# Patient Record
Sex: Female | Born: 1975 | Race: White | Hispanic: No | Marital: Married | Smoking: Former smoker
Health system: Southern US, Community
[De-identification: ages and names within clinical notes are randomized; demographics above are authoritative.]

## PROBLEM LIST (undated history)

## (undated) DIAGNOSIS — F32A Depression, unspecified: Secondary | ICD-10-CM

## (undated) DIAGNOSIS — K219 Gastro-esophageal reflux disease without esophagitis: Secondary | ICD-10-CM

## (undated) DIAGNOSIS — F329 Major depressive disorder, single episode, unspecified: Secondary | ICD-10-CM

## (undated) HISTORY — PX: WRIST FRACTURE SURGERY: SHX121

---

## 2005-02-21 ENCOUNTER — Encounter: Admission: RE | Admit: 2005-02-21 | Discharge: 2005-02-21 | Payer: Self-pay | Admitting: Family Medicine

## 2005-05-09 HISTORY — PX: OTHER SURGICAL HISTORY: SHX169

## 2005-09-14 ENCOUNTER — Other Ambulatory Visit: Admission: RE | Admit: 2005-09-14 | Discharge: 2005-09-14 | Payer: Self-pay | Admitting: Gynecology

## 2006-01-24 ENCOUNTER — Encounter (INDEPENDENT_AMBULATORY_CARE_PROVIDER_SITE_OTHER): Payer: Self-pay | Admitting: *Deleted

## 2006-01-24 ENCOUNTER — Ambulatory Visit (HOSPITAL_COMMUNITY): Admission: RE | Admit: 2006-01-24 | Discharge: 2006-01-24 | Payer: Self-pay | Admitting: Obstetrics and Gynecology

## 2007-01-01 ENCOUNTER — Other Ambulatory Visit: Admission: RE | Admit: 2007-01-01 | Discharge: 2007-01-01 | Payer: Self-pay | Admitting: Gynecology

## 2007-01-01 ENCOUNTER — Ambulatory Visit: Payer: Self-pay | Admitting: Oncology

## 2007-01-10 LAB — CBC WITH DIFFERENTIAL (CANCER CENTER ONLY)
BASO#: 0.1 10*3/uL (ref 0.0–0.2)
BASO%: 0.7 % (ref 0.0–2.0)
EOS%: 1.9 % (ref 0.0–7.0)
Eosinophils Absolute: 0.1 10*3/uL (ref 0.0–0.5)
HCT: 40.4 % (ref 34.8–46.6)
HGB: 14.1 g/dL (ref 11.6–15.9)
LYMPH#: 2.9 10*3/uL (ref 0.9–3.3)
LYMPH%: 41.4 % (ref 14.0–48.0)
MCH: 30.9 pg (ref 26.0–34.0)
MCHC: 34.9 g/dL (ref 32.0–36.0)
MCV: 89 fL (ref 81–101)
MONO#: 0.5 10*3/uL (ref 0.1–0.9)
MONO%: 7.4 % (ref 0.0–13.0)
NEUT#: 3.4 10*3/uL (ref 1.5–6.5)
NEUT%: 48.6 % (ref 39.6–80.0)
Platelets: 275 10*3/uL (ref 145–400)
RBC: 4.56 10*6/uL (ref 3.70–5.32)
RDW: 11.4 % (ref 10.5–14.6)
WBC: 7 10*3/uL (ref 3.9–10.0)

## 2007-01-13 LAB — COMPREHENSIVE METABOLIC PANEL
ALT: 18 U/L (ref 0–35)
AST: 21 U/L (ref 0–37)
Albumin: 3.5 g/dL (ref 3.5–5.2)
Alkaline Phosphatase: 53 U/L (ref 39–117)
BUN: 13 mg/dL (ref 6–23)
CO2: 25 mEq/L (ref 19–32)
Calcium: 8.8 mg/dL (ref 8.4–10.5)
Chloride: 102 mEq/L (ref 96–112)
Creatinine, Ser: 0.78 mg/dL (ref 0.40–1.20)
Glucose, Bld: 85 mg/dL (ref 70–99)
Potassium: 4.5 mEq/L (ref 3.5–5.3)
Sodium: 133 mEq/L — ABNORMAL LOW (ref 135–145)
Total Bilirubin: 0.5 mg/dL (ref 0.3–1.2)
Total Protein: 6.3 g/dL (ref 6.0–8.3)

## 2007-01-13 LAB — HYPERCOAGULABLE PANEL, COMPREHENSIVE
AntiThromb III Func: 97 % (ref 76–126)
Anticardiolipin IgA: <7 [APL'U] (ref <13)
Anticardiolipin IgG: 8 [GPL'U] (ref <11)
Anticardiolipin IgM: <7 [MPL'U] (ref <10)
Beta-2 Glyco I IgG: <4 U/mL (ref <20)
Beta-2-Glycoprotein I IgA: 4 U/mL (ref <10)
Beta-2-Glycoprotein I IgM: <4 U/mL (ref <10)
DRVVT: 42.8 secs (ref 36.1–47.0)
Homocysteine: 7.1 umol/L (ref 4.0–15.4)
PTT Lupus Anticoagulant: 58.7 secs — ABNORMAL HIGH (ref 36.3–48.8)
PTTLA 4:1 Mix: 49.5 secs — ABNORMAL HIGH (ref 36.3–48.8)
PTTLA Confirmation: 0 secs (ref <8.0)
Protein C Activity: 100 % (ref 75–133)
Protein C, Total: 72 % (ref 70–140)
Protein S Activity: 112 % (ref 69–129)
Protein S Ag, Total: 110 % (ref 70–140)

## 2007-02-14 ENCOUNTER — Ambulatory Visit (HOSPITAL_COMMUNITY): Admission: RE | Admit: 2007-02-14 | Discharge: 2007-02-14 | Payer: Self-pay | Admitting: Gynecology

## 2008-01-17 ENCOUNTER — Other Ambulatory Visit: Admission: RE | Admit: 2008-01-17 | Discharge: 2008-01-17 | Payer: Self-pay | Admitting: Gynecology

## 2008-06-01 ENCOUNTER — Inpatient Hospital Stay (HOSPITAL_COMMUNITY): Admission: AD | Admit: 2008-06-01 | Discharge: 2008-06-01 | Payer: Self-pay | Admitting: Obstetrics and Gynecology

## 2008-07-08 ENCOUNTER — Ambulatory Visit (HOSPITAL_COMMUNITY): Admission: RE | Admit: 2008-07-08 | Discharge: 2008-07-08 | Payer: Self-pay | Admitting: Obstetrics and Gynecology

## 2008-08-05 ENCOUNTER — Ambulatory Visit (HOSPITAL_COMMUNITY): Admission: RE | Admit: 2008-08-05 | Discharge: 2008-08-05 | Payer: Self-pay | Admitting: Occupational Therapy

## 2008-09-30 ENCOUNTER — Inpatient Hospital Stay (HOSPITAL_COMMUNITY): Admission: AD | Admit: 2008-09-30 | Discharge: 2008-10-12 | Payer: Self-pay | Admitting: Obstetrics & Gynecology

## 2008-10-14 ENCOUNTER — Inpatient Hospital Stay (HOSPITAL_COMMUNITY): Admission: AD | Admit: 2008-10-14 | Discharge: 2008-10-22 | Payer: Self-pay | Admitting: Obstetrics and Gynecology

## 2008-10-18 ENCOUNTER — Encounter (INDEPENDENT_AMBULATORY_CARE_PROVIDER_SITE_OTHER): Payer: Self-pay | Admitting: Obstetrics and Gynecology

## 2008-10-23 ENCOUNTER — Encounter: Admission: RE | Admit: 2008-10-23 | Discharge: 2008-11-21 | Payer: Self-pay | Admitting: Obstetrics and Gynecology

## 2008-11-22 ENCOUNTER — Encounter: Admission: RE | Admit: 2008-11-22 | Discharge: 2008-12-22 | Payer: Self-pay | Admitting: Obstetrics and Gynecology

## 2008-12-23 ENCOUNTER — Encounter: Admission: RE | Admit: 2008-12-23 | Discharge: 2009-01-22 | Payer: Self-pay | Admitting: Obstetrics and Gynecology

## 2009-01-23 ENCOUNTER — Encounter: Admission: RE | Admit: 2009-01-23 | Discharge: 2009-02-21 | Payer: Self-pay | Admitting: Obstetrics and Gynecology

## 2009-02-22 ENCOUNTER — Encounter: Admission: RE | Admit: 2009-02-22 | Discharge: 2009-03-24 | Payer: Self-pay | Admitting: Obstetrics and Gynecology

## 2009-03-25 ENCOUNTER — Encounter: Admission: RE | Admit: 2009-03-25 | Discharge: 2009-04-23 | Payer: Self-pay | Admitting: Obstetrics and Gynecology

## 2009-04-24 ENCOUNTER — Encounter: Admission: RE | Admit: 2009-04-24 | Discharge: 2009-05-07 | Payer: Self-pay | Admitting: Obstetrics and Gynecology

## 2009-05-25 ENCOUNTER — Encounter: Admission: RE | Admit: 2009-05-25 | Discharge: 2009-06-22 | Payer: Self-pay | Admitting: Obstetrics and Gynecology

## 2010-08-16 LAB — CBC
HCT: 27.1 % — ABNORMAL LOW (ref 36.0–46.0)
HCT: 27.3 % — ABNORMAL LOW (ref 36.0–46.0)
HCT: 31.3 % — ABNORMAL LOW (ref 36.0–46.0)
HCT: 36.8 % (ref 36.0–46.0)
Hemoglobin: 11.2 g/dL — ABNORMAL LOW (ref 12.0–15.0)
Hemoglobin: 13.1 g/dL (ref 12.0–15.0)
Hemoglobin: 9.7 g/dL — ABNORMAL LOW (ref 12.0–15.0)
Hemoglobin: 9.8 g/dL — ABNORMAL LOW (ref 12.0–15.0)
MCHC: 35.6 g/dL (ref 30.0–36.0)
MCHC: 35.7 g/dL (ref 30.0–36.0)
MCHC: 35.7 g/dL (ref 30.0–36.0)
MCHC: 35.8 g/dL (ref 30.0–36.0)
MCV: 92.4 fL (ref 78.0–100.0)
MCV: 92.5 fL (ref 78.0–100.0)
MCV: 92.5 fL (ref 78.0–100.0)
MCV: 92.6 fL (ref 78.0–100.0)
Platelets: 246 10*3/uL (ref 150–400)
Platelets: 258 10*3/uL (ref 150–400)
Platelets: 293 10*3/uL (ref 150–400)
Platelets: 335 10*3/uL (ref 150–400)
RBC: 2.93 MIL/uL — ABNORMAL LOW (ref 3.87–5.11)
RBC: 2.95 MIL/uL — ABNORMAL LOW (ref 3.87–5.11)
RBC: 3.39 MIL/uL — ABNORMAL LOW (ref 3.87–5.11)
RBC: 3.97 MIL/uL (ref 3.87–5.11)
RDW: 12.8 % (ref 11.5–15.5)
RDW: 12.9 % (ref 11.5–15.5)
RDW: 12.9 % (ref 11.5–15.5)
RDW: 13 % (ref 11.5–15.5)
WBC: 11.8 10*3/uL — ABNORMAL HIGH (ref 4.0–10.5)
WBC: 13.5 10*3/uL — ABNORMAL HIGH (ref 4.0–10.5)
WBC: 8.5 10*3/uL (ref 4.0–10.5)
WBC: 9.5 10*3/uL (ref 4.0–10.5)

## 2010-08-16 LAB — RPR
RPR Ser Ql: NONREACTIVE
RPR Ser Ql: NONREACTIVE

## 2010-08-16 LAB — FETAL FIBRONECTIN: Fetal Fibronectin: NEGATIVE

## 2010-08-16 LAB — STREP B DNA PROBE: Strep Group B Ag: NEGATIVE

## 2010-08-17 LAB — MAGNESIUM: Magnesium: 3.6 mg/dL — ABNORMAL HIGH (ref 1.5–2.5)

## 2010-08-17 LAB — CBC
HCT: 31.3 % — ABNORMAL LOW (ref 36.0–46.0)
Hemoglobin: 11.2 g/dL — ABNORMAL LOW (ref 12.0–15.0)
MCHC: 35.9 g/dL (ref 30.0–36.0)
MCV: 93.1 fL (ref 78.0–100.0)
Platelets: 245 10*3/uL (ref 150–400)
RBC: 3.36 MIL/uL — ABNORMAL LOW (ref 3.87–5.11)
RDW: 12.9 % (ref 11.5–15.5)
WBC: 9.2 10*3/uL (ref 4.0–10.5)

## 2010-09-21 NOTE — Discharge Summary (Signed)
NAME:  [name], MA NO.:  [id number]   MEDICAL RECORD NO.:  [id number]          PATIENT TYPE:  INP   LOCATION:  9318                          FACILITY:  [facility]   PHYSICIAN:  Randye Lobo, M.D.   DATE OF BIRTH:  1976/02/15   DATE OF ADMISSION:  10/14/2008  DATE OF DISCHARGE:  10/22/2008                               DISCHARGE SUMMARY   FINAL DIAGNOSES:  1. Twin gestation at 13 weeks.  2. Preterm premature rupture of membranes.  3. Preterm labor.   PROCEDURE:  Primary low transverse cesarean section.   SURGEON:  Carrington Clamp, MD   ASSISTANT:  Teena Irani. Arlyce Dice, MD   COMPLICATIONS:  None.   This 35 year old G4, P 0-0-3-0 was admitted with preterm labor again on  October 14, 2008.  The patient had been admitted previously about 2 weeks  prior.  She did receive mag sulfate tocolysis and she did have 2 doses  of betamethasone and was known to have a negative group B strep culture.  The patient had been stable until now when she started having some  bleeding yesterday now with contractions about 5 an hour.  The patient  does have a LATEX allergy and conceived this pregnancy on in vitro  fertilization.  Babies at this time were still in breech presentation  and therefore a cesarean section would be needed for delivery.  Upon  admission, mag sulfate was started again.  The patient continued to have  some contractions.  She was also using some oral Procardia as needed.  On October 18, 2008, the patient had spontaneous rupture of membranes,  clear fluid noted.  At this point, a decision was made to proceed with  the cesarean section secondary to a breech presentation.  The patient  was taken to the operating room on October 18, 2008, by Dr. Carrington Clamp, where a primary low transverse cesarean section was performed  with the delivery of a twin A female infant weighing 1817 g with Apgars  of 7 and 9, baby B was a female in a breech presentation, weighing 1814 g  with  Apgars of 4, 5, and 7.  Delivery went without complications.  The  babies were taken to the NICU and were stable.  The patient's delivery  was without complications.  Her postoperative course was benign without  any significant fevers.  She was felt ready for discharge on  postoperative day #4.  The babies were doing well in the NICU.  She was  sent home on a regular diet, told to decrease activities, told to  continue her prenatal vitamins, and iron supplement twice daily, was  given Percocet 1-2 q.4-6 h. as needed for pain, told she could use over-  the-counter ibuprofen up to 600 mg every 6 hours as needed for pain, was  to follow up in the office in 2 weeks with Dr. Henderson Cloud.  Instructions  and precautions were reviewed with the patient.   LABORATORY ON DISCHARGE:  The patient had a hemoglobin of 9.8, white  blood cell count of 9.5, and platelets of 246,000.      Leilani Able,  P.A.-C.      Randye Lobo, M.D.  Electronically Signed    MB/MEDQ  D:  10/29/2008  T:  10/30/2008  Job:  [id number]

## 2010-09-21 NOTE — Op Note (Signed)
NAME:  Mackenzie Khan, STEPKA NO.:  [id number]   MEDICAL RECORD NO.:  [id number]          PATIENT TYPE:  INP   LOCATION:  9156                          [facility]ACILITY:  [facility]   PHYSICIAN:  Carrington Clamp, M.D. DATE OF BIRTH:  10-01-1975   DATE OF PROCEDURE:  10/18/2008  DATE OF DISCHARGE:                               OPERATIVE REPORT   PREOPERATIVE DIAGNOSIS:  Twin pregnancy at 33 weeks with premature  rupture of membranes and preterm labor.   POSTOPERATIVE DIAGNOSIS:  Twin pregnancy at 33 weeks with premature  rupture of membranes and preterm labor.   PROCEDURE:  Primary low transverse cesarean section.   SURGEON:  Carrington Clamp, MD   ASSISTANT:  Ilda Mori, MD   ANESTHESIA:  Spinal.   BLOOD LOSS:  700 mL.   IV FLUIDS:  900 mL.   URINE OUTPUT:  600 mL.   MEDICATIONS:  Ancef and Pitocin.   FINDINGS:  Baby A female, breech presentation, Apgars 7 and 9, weight  1817 g and cord pH is 732.  Baby B female, breech presentation, 1 minute  Apgar 4, 5 minutes Apgar 7, weight 1814 g and pH was 734.  They were  born at 23:58 and 23:59.  Normal uterus, tubes and ovaries were  otherwise seen.   COMPLICATIONS:  None.   COUNTS:  Correct x3.   REASON FOR OPERATION:  Mackenzie Khan had been admitted for preterm  labor approximately 2-1/2 weeks ago and then readmitted on this previous  Tuesday for renewed preterm labor.  The patient had received  betamethasone on the first visit.  The patient was receiving magnesium  sulfate on the second visit.  At approximately 22:50 on June 12, the  patient spontaneously ruptured membranes with copious amounts of clear  fluid.  After discussion with the patient about the risks, benefits and  alternatives, it was decided to proceed with cesarean section because  one of the babies was breech, although it was difficult to tell the time  secondary to lack of amniotic fluid in one of the sacs and also to avoid  the risk of prolonged  rupture of membranes and infection.  All risks,  benefits and alternatives had been discussed with the patient and the  patient agreed to proceed with a primary low transverse cesarean  section.   TECHNIQUE:  After adequate spinal anesthesia was achieved, the patient  was prepped and draped in the usual sterile fashion in a dorsal supine  position with a leftward tilt.  A Pfannenstiel skin incision was made  with a scalpel and carried down to fascia with Bovie cautery.  The  fascia was incised in the midline with the scalpel and carried in  transverse curvilinear manner with the Mayo scissors.  The rectus  muscles were identified and split in the midline.  The bowel free  portion of peritoneum was entered into bluntly with the pair of  hemostats.  The peritoneum was then incised in a superior and inferior  manner with the good visualization of bowel and the bladder.   The Alexis instrument was placed inside the abdomen and the  vesicouterine fascia tented  up.  This was incised in a transverse  curvilinear manner and the bladder flap was created bluntly.  Once the  bladder was retracted out inferiorly, a 2-cm incision was made in the  upper portion of the lower uterine segment until the clear fluid could  be noted on entry to the amnion.  The uterine incision was extended in a  transverse curvilinear manner bluntly.  Baby A was identified in the  complete breech presentation and the buttocks delivered through the  incision without complication.  The baby A was then delivered without  complication through the incision in the usual breech manner.  Cord was  clamped and cut, and the baby was handed to the awaiting pediatrics.  The baby B was discovered to be footling breech after rupture of  membranes was affected.  Baby B was then delivered footling breech in  the usual fashion without complication as well.  Both babies were born  on October 18, 2008, at 11:58 and 11:59.   The babies had  been handed to awaiting neonatologist.  Cord gases were  obtained and cord bloods for both babies.  Baby B's cord was clamped  with an umbilical clamp.  The placenta was then delivered manually and  sent to Pathology.  The uterus was then cleared of all debris with a wet  lap and the incision was closed with a running lock stitch of 0  Monocryl.  An imbricating layer of 0 Monocryl was performed as well.  A  small defect in the serosa and few layers of myometrium was noted to  have been caused by the entry into the peritoneum.  This was just  superior to the actual uterine incision.  A single figure-of-eight  stitch was placed in this to ensure hemostasis.   Irrigation was performed.  The ovaries and tubes were inspected, found  to be normal and the uterine incision was found to be hemostatic.  The  peritoneum was then closed with a running stitch of 2-0 Vicryl.  This  incorporated the rectus muscles as separate layer.  The fascia was then  closed with a running stitch of 0 Vicryl.  The subcutaneous tissue was  rendered hemostatic with Bovie cautery irrigation.  The subcutaneous  tissue was then closed with interrupted stitches of 2-0 plain gut.  The  skin was closed with staples.  The patient tolerated the procedure well  and was returned to recovery room in stable condition.      Carrington Clamp, M.D.  Electronically Signed     MH/MEDQ  D:  10/19/2008  T:  10/19/2008  Job:  [id number]

## 2010-09-24 NOTE — Discharge Summary (Signed)
NAME:  Mackenzie Khan, Mackenzie Khan NO.:  [id number]   MEDICAL RECORD NO.:  [id number]          PATIENT TYPE:  INP   LOCATION:  9318                          [facility]ACILITY:  [facility]   PHYSICIAN:  Kendra H. Tenny Craw, MD     DATE OF BIRTH:  May 16, 1975   DATE OF ADMISSION:  10/14/2008  DATE OF DISCHARGE:  10/22/2008                               DISCHARGE SUMMARY   FINAL DIAGNOSES:  Intrauterine twin gestation at 30-1/2 weeks, latex  allergy, preterm labor.   COMPLICATIONS:  None.   This 35 year old, G4, P0-0-3-0, who presents at 30-3/[redacted] weeks gestation  complaining of some bloody discharge and contractions.  The patient's  antepartum course up to this point had been complicated by a history of  IVF with this pregnancy leading to a twin gestation.  No to latex  allergy.  Otherwise, antepartum course had been uncomplicated up to this  point.  The patient was admitted, started on betamethasone protocol, was  started on penicillin for unknown group B strep status, was being noted  to develop some regular painful contractions were mag sulfate tocolysis  was begun.  Ultrasound was performed.  Twin A and twin B were both  breech with concordant growth.  Cervix was about 1 cm dilated upon  admission.  The patient received both doses of betamethasone, was  continued to be watched in-house.  Mag sulfate was continued throughout  first couple days in the hospital.  Maternal fetal medicine was  consulted and saw the patient.  Babies continued to be stable.  The  patient had a NICU consult.  By hospital day #4, no major change on  cervical exam.  There was some bloody show.  Indocin was added mag  sulfate for tocolysis.  By hospital day #5, the patient had good  response to Indocin.  Mag sulfate was able to be stopped.  The patient  was continued on the Indocin.  Actually, the patient was restarted on  her mag sulfate when she noted some increase in her contractions after  being stopped, after being  weaned off.  By hospital day 37, the patient  was able to be weaned off her mag sulfate.  She had already received her  2 doses of betamethasone.  She was continued on bedrest and continued to  be monitored closely.  The patient was started on Procardia orally once  mag sulfate was weaned off.  She also needed some rescue terbutaline  added at different times.  By hospital day #12, the patient's cervix was  unchanged despite her activity.  The patient also had a negative fetal  fibronectin.  At this point, a discussion was held with the patient  regarding the management at home.  The patient was felt ready for  discharge.  She was not having any more bleeding, still having some mild  contractions, but no change in cervix.  She was sent home on Procardia  10 mg to take every 6 hours.  Also was given a prescription for  terbutaline needed to use as directed or as needed.  Bedrest at home.  Follow up in our office on Tuesday  with instructions and precautions  were reviewed with the patient's.   LABORATORY ON DISCHARGE:  The patient had a hemoglobin back from May 30  of 11.2, white blood cell count of 9.2, and platelets of 245,000.  The  patient also had a negative fetal fibronectin performed on June 5 as  well as a negative group B strep culture on June 1.      Leilani Able, P.A.-C.      Freddrick March. Tenny Craw, MD  Electronically Signed    MB/MEDQ  D:  10/29/2008  T:  10/30/2008  Job:  [id number]

## 2010-09-24 NOTE — Op Note (Signed)
NAME:  Mackenzie Khan, Mackenzie Khan NO.:  [id number]   MEDICAL RECORD NO.:  [id number]          PATIENT TYPE:  AMB   LOCATION:  SDC                           [facility]ACILITY:  [facility]   PHYSICIAN:  Carrington Clamp, M.D. DATE OF BIRTH:  [birth year]   DATE OF PROCEDURE:  01/24/2006  DATE OF DISCHARGE:                                 OPERATIVE REPORT   PREOPERATIVE DIAGNOSIS:  Encephalic fetus, desires voluntary interruption of  pregnancy.   POSTOPERATIVE DIAGNOSIS:  Encephalic fetus, desires voluntary interruption  of pregnancy.   PROCEDURE:  Dilation and evacuation.   SURGEON:  Carrington Clamp, M.D.   ASSISTANT:  None.   ANESTHESIA:  LMA.   SPECIMENS:  Uterine contents to Pathology.   ESTIMATED BLOOD LOSS:  Minimal.   IV FLUID:  600 mL.   URINE OUTPUT:  Not measured.   COMPLICATIONS:  None.   FINDINGS:  A 13 weeks' size uterus down to 8 weeks' size with good CRIE.   MEDICATIONS:  Methergine.   COUNTS:  Correct x3.   TECHNIQUE:  After adequate general anesthesia was received, the patient is  prepped and draped using sterile fashion in the dorsolithotomy position.  The bladder was emptied with a catheter and speculum placed in the vagina.  Six-tooth tenaculum is used to stabilize the cervix and the cervix is  sequentially dilated with Shawnie Pons dilators.  A 12 mm curet was passed into the  uterus and suction curettage was performed.  Alternating suction curettage  with sharp curettage was then performed to remove as much material as  possible.  There was good CRIE anterior and posterior, though on the top of  fundus  there seemed to be less crie.  However, multiple introductions of  the suction curet and  the sharp curet was unable to remove any additional tissue.  The uterus did  clamp down to 8 weeks' size and there was minimal bleeding.  The patient was  given doses of Methergine and all instruments were withdrawn from the vagina  and the patient was then returned to  recovery room in stable condition.      Carrington Clamp, M.D.  Electronically Signed     MH/MEDQ  D:  01/24/2006  T:  01/25/2006  Job:  [id number]

## 2010-11-24 ENCOUNTER — Other Ambulatory Visit: Payer: Self-pay | Admitting: Obstetrics and Gynecology

## 2011-01-27 LAB — GC/CHLAMYDIA PROBE AMP, GENITAL
Chlamydia: NEGATIVE
Gonorrhea: NEGATIVE

## 2011-01-27 LAB — RUBELLA ANTIBODY, IGM: Rubella: IMMUNE

## 2011-01-27 LAB — HEPATITIS B SURFACE ANTIGEN: Hepatitis B Surface Ag: NEGATIVE

## 2011-01-27 LAB — ABO/RH: RH Type: POSITIVE

## 2011-01-27 LAB — RPR: RPR: NONREACTIVE

## 2011-01-27 LAB — HIV ANTIBODY (ROUTINE TESTING W REFLEX): HIV: NONREACTIVE

## 2011-01-27 LAB — ANTIBODY SCREEN: Antibody Screen: NEGATIVE

## 2011-07-20 ENCOUNTER — Encounter (HOSPITAL_COMMUNITY): Payer: Self-pay | Admitting: Pharmacist

## 2011-07-25 ENCOUNTER — Encounter (HOSPITAL_COMMUNITY): Payer: Self-pay

## 2011-07-26 ENCOUNTER — Encounter (HOSPITAL_COMMUNITY): Payer: Self-pay

## 2011-07-26 ENCOUNTER — Encounter (HOSPITAL_COMMUNITY)
Admission: RE | Admit: 2011-07-26 | Discharge: 2011-07-26 | Disposition: A | Discharge status: Home / Self Care | Payer: BC Managed Care – PPO | Source: Ambulatory Visit | Attending: Obstetrics and Gynecology | Admitting: Obstetrics and Gynecology

## 2011-07-26 HISTORY — DX: Gastro-esophageal reflux disease without esophagitis: K21.9

## 2011-07-26 HISTORY — DX: Depression, unspecified: F32.A

## 2011-07-26 HISTORY — DX: Major depressive disorder, single episode, unspecified: F32.9

## 2011-07-26 LAB — SURGICAL PCR SCREEN
MRSA, PCR: NEGATIVE
Staphylococcus aureus: NEGATIVE

## 2011-07-26 LAB — CBC
HCT: 39 % (ref 36.0–46.0)
MCHC: 34.9 g/dL (ref 30.0–36.0)
Platelets: 258 10*3/uL (ref 150–400)
RDW: 13.4 % (ref 11.5–15.5)
WBC: 9.5 10*3/uL (ref 4.0–10.5)

## 2011-07-26 NOTE — Patient Instructions (Addendum)
   Your procedure is scheduled on: Friday April 5th  Enter through the [employer] of [facility] at: [employer] up the phone at the desk and dial [phone] and inform us of your arrival.  Please call this number if you have any problems the morning of surgery: [phone]  Remember: Do not eat food after midnight: Thursday Do not drink clear liquids after: midnight Thursday Take these medicines the morning of surgery with a SIP OF WATER:  prilosec  Do not wear jewelry, make-up, or FINGER nail polish Do not wear lotions, powders, perfumes or deodorant. Do not shave 48 hours prior to surgery. Do not bring valuables to the hospital. Contacts, dentures or bridgework may not be worn into surgery.  Leave suitcase in the car. After Surgery it may be brought to your room. For patients being admitted to the hospital, checkout time is 11:00am the day of discharge.     Remember to use your hibiclens as instructed.Please shower with 1/2 bottle the evening before your surgery and the other 1/2 bottle the morning of surgery. Neck down avoiding private area.

## 2011-07-27 ENCOUNTER — Other Ambulatory Visit: Payer: Self-pay | Admitting: Obstetrics and Gynecology

## 2011-08-02 ENCOUNTER — Encounter (HOSPITAL_COMMUNITY): Payer: Self-pay | Admitting: Anesthesiology

## 2011-08-02 ENCOUNTER — Inpatient Hospital Stay (HOSPITAL_COMMUNITY)
Admission: AD | Admit: 2011-08-02 | Discharge: 2011-08-06 | DRG: 371 | Disposition: A | Discharge status: Home / Self Care | Payer: BC Managed Care – PPO | Source: Ambulatory Visit | Attending: Obstetrics and Gynecology | Admitting: Obstetrics and Gynecology

## 2011-08-02 ENCOUNTER — Encounter (HOSPITAL_COMMUNITY): Payer: Self-pay | Admitting: *Deleted

## 2011-08-02 DIAGNOSIS — Z01818 Encounter for other preprocedural examination: Secondary | ICD-10-CM

## 2011-08-02 DIAGNOSIS — Z01812 Encounter for preprocedural laboratory examination: Secondary | ICD-10-CM

## 2011-08-02 DIAGNOSIS — O34219 Maternal care for unspecified type scar from previous cesarean delivery: Principal | ICD-10-CM | POA: Diagnosis present

## 2011-08-02 DIAGNOSIS — O321XX0 Maternal care for breech presentation, not applicable or unspecified: Secondary | ICD-10-CM | POA: Diagnosis present

## 2011-08-02 MED ORDER — TERBUTALINE SULFATE 1 MG/ML IJ SOLN
0.2500 mg | Freq: Once | INTRAMUSCULAR | Status: AC
  Administered 2011-08-02: 0.25 mg via SUBCUTANEOUS
  Filled 2011-08-02: qty 1

## 2011-08-02 NOTE — Anesthesia Preprocedure Evaluation (Deleted)
Anesthesia Evaluation  Patient identified by MRN, date of birth, ID band Patient awake    Reviewed: Allergy & Precautions, H&P , NPO status , Patient's Chart, lab work & pertinent test results  Airway Mallampati: III      Dental No notable dental hx.    Pulmonary neg pulmonary ROS,  breath sounds clear to auscultation  Pulmonary exam normal       Cardiovascular Exercise Tolerance: Good negative cardio ROS  Rhythm:regular Rate:Normal     Neuro/Psych PSYCHIATRIC DISORDERS negative neurological ROS  negative psych ROS   GI/Hepatic negative GI ROS, Neg liver ROS, GERD-  ,  Endo/Other  negative endocrine ROSMorbid obesity  Renal/GU negative Renal ROS  negative genitourinary   Musculoskeletal   Abdominal Normal abdominal exam  (+)   Peds  Hematology negative hematology ROS (+)   Anesthesia Other Findings   Reproductive/Obstetrics (+) Pregnancy                           Anesthesia Physical Anesthesia Plan  ASA: III  Anesthesia Plan: Spinal   Post-op Pain Management:    Induction:   Airway Management Planned:   Additional Equipment:   Intra-op Plan:   Post-operative Plan:   Informed Consent: I have reviewed the patients History and Physical, chart, labs and discussed the procedure including the risks, benefits and alternatives for the proposed anesthesia with the patient or authorized representative who has indicated his/her understanding and acceptance.     Plan Discussed with: Anesthesiologist, CRNA and Surgeon  Anesthesia Plan Comments:         Anesthesia Quick Evaluation

## 2011-08-02 NOTE — MAU Note (Signed)
PT SAYS SHE STARTED HURT BAD AT 730PM.   IN OFFICE   - CLOSED

## 2011-08-03 ENCOUNTER — Encounter (HOSPITAL_COMMUNITY)
Admission: AD | Disposition: A | Discharge status: Home / Self Care | Payer: Self-pay | Source: Ambulatory Visit | Attending: Obstetrics and Gynecology

## 2011-08-03 ENCOUNTER — Encounter (HOSPITAL_COMMUNITY): Payer: Self-pay | Admitting: Anesthesiology

## 2011-08-03 ENCOUNTER — Encounter (HOSPITAL_COMMUNITY): Payer: Self-pay | Admitting: *Deleted

## 2011-08-03 ENCOUNTER — Inpatient Hospital Stay (HOSPITAL_COMMUNITY): Payer: BC Managed Care – PPO | Admitting: Anesthesiology

## 2011-08-03 LAB — CBC
HCT: 40 % (ref 36.0–46.0)
MCH: 31.1 pg (ref 26.0–34.0)
MCV: 87.5 fL (ref 78.0–100.0)
RBC: 4.57 MIL/uL (ref 3.87–5.11)
WBC: 12.9 10*3/uL — ABNORMAL HIGH (ref 4.0–10.5)

## 2011-08-03 SURGERY — Surgical Case
Anesthesia: Spinal | Site: Abdomen | Wound class: Clean Contaminated

## 2011-08-03 MED ORDER — IBUPROFEN 600 MG PO TABS
600.0000 mg | ORAL_TABLET | Freq: Four times a day (QID) | ORAL | Status: DC
  Administered 2011-08-03 – 2011-08-06 (×12): 600 mg via ORAL
  Filled 2011-08-03 (×4): qty 1

## 2011-08-03 MED ORDER — CITRIC ACID-SODIUM CITRATE 334-500 MG/5ML PO SOLN: 30.0000 mL | Freq: Once | ORAL | Status: AC

## 2011-08-03 MED ORDER — LANOLIN HYDROUS EX OINT: 1.0000 "application " | TOPICAL_OINTMENT | CUTANEOUS | Status: DC | PRN

## 2011-08-03 MED ORDER — METHYLERGONOVINE MALEATE 0.2 MG/ML IJ SOLN: 0.2000 mg | INTRAMUSCULAR | Status: DC | PRN

## 2011-08-03 MED ORDER — FLEET ENEMA 7-19 GM/118ML RE ENEM: 1.0000 | ENEMA | Freq: Every day | RECTAL | Status: DC | PRN

## 2011-08-03 MED ORDER — FENTANYL CITRATE 0.05 MG/ML IJ SOLN: 25.0000 ug | INTRAMUSCULAR | Status: DC | PRN

## 2011-08-03 MED ORDER — KETOROLAC TROMETHAMINE 30 MG/ML IJ SOLN
30.0000 mg | Freq: Four times a day (QID) | INTRAMUSCULAR | Status: DC | PRN
  Administered 2011-08-03: 30 mg via INTRAVENOUS

## 2011-08-03 MED ORDER — FERROUS SULFATE 325 (65 FE) MG PO TABS
325.0000 mg | ORAL_TABLET | Freq: Two times a day (BID) | ORAL | Status: DC
  Administered 2011-08-03 – 2011-08-06 (×5): 325 mg via ORAL
  Filled 2011-08-03 (×5): qty 1

## 2011-08-03 MED ORDER — NALBUPHINE HCL 10 MG/ML IJ SOLN
5.0000 mg | INTRAMUSCULAR | Status: DC | PRN
  Filled 2011-08-03: qty 1

## 2011-08-03 MED ORDER — MEPERIDINE HCL 25 MG/ML IJ SOLN
INTRAMUSCULAR | Status: AC
  Filled 2011-08-03: qty 1

## 2011-08-03 MED ORDER — LACTATED RINGERS IV SOLN: INTRAVENOUS | Status: DC

## 2011-08-03 MED ORDER — ZOLPIDEM TARTRATE 5 MG PO TABS: 5.0000 mg | ORAL_TABLET | Freq: Every evening | ORAL | Status: DC | PRN

## 2011-08-03 MED ORDER — OXYTOCIN 10 UNIT/ML IJ SOLN
INTRAMUSCULAR | Status: DC | PRN
  Administered 2011-08-03: 20 [IU] via INTRAMUSCULAR

## 2011-08-03 MED ORDER — EPHEDRINE SULFATE 50 MG/ML IJ SOLN
INTRAMUSCULAR | Status: DC | PRN
  Administered 2011-08-03: 5 mg via INTRAVENOUS

## 2011-08-03 MED ORDER — MENTHOL 3 MG MT LOZG: 1.0000 | LOZENGE | OROMUCOSAL | Status: DC | PRN

## 2011-08-03 MED ORDER — MORPHINE SULFATE (PF) 0.5 MG/ML IJ SOLN: INTRAMUSCULAR | Status: DC | PRN

## 2011-08-03 MED ORDER — MORPHINE SULFATE (PF) 0.5 MG/ML IJ SOLN
INTRAMUSCULAR | Status: DC | PRN
  Administered 2011-08-03: .1 mg via INTRATHECAL

## 2011-08-03 MED ORDER — MIDAZOLAM HCL 2 MG/2ML IJ SOLN: 0.5000 mg | Freq: Once | INTRAMUSCULAR | Status: DC | PRN

## 2011-08-03 MED ORDER — OXYTOCIN 20 UNITS IN LACTATED RINGERS INFUSION - SIMPLE
INTRAVENOUS | Status: AC
  Filled 2011-08-03: qty 1000

## 2011-08-03 MED ORDER — LACTATED RINGERS IV SOLN
INTRAVENOUS | Status: DC | PRN
  Administered 2011-08-03 (×3): via INTRAVENOUS

## 2011-08-03 MED ORDER — KETOROLAC TROMETHAMINE 30 MG/ML IJ SOLN: 30.0000 mg | Freq: Four times a day (QID) | INTRAMUSCULAR | Status: DC | PRN

## 2011-08-03 MED ORDER — MEPERIDINE HCL 25 MG/ML IJ SOLN
6.2500 mg | INTRAMUSCULAR | Status: DC | PRN
Start: 2011-08-03 — End: 2011-08-03
  Administered 2011-08-03: 6.25 mg via INTRAVENOUS

## 2011-08-03 MED ORDER — ONDANSETRON HCL 4 MG/2ML IJ SOLN: 4.0000 mg | INTRAMUSCULAR | Status: DC | PRN

## 2011-08-03 MED ORDER — DIPHENHYDRAMINE HCL 25 MG PO CAPS
25.0000 mg | ORAL_CAPSULE | ORAL | Status: DC | PRN
  Administered 2011-08-03: 25 mg via ORAL
  Filled 2011-08-03: qty 1

## 2011-08-03 MED ORDER — ONDANSETRON HCL 4 MG/2ML IJ SOLN
4.0000 mg | Freq: Four times a day (QID) | INTRAMUSCULAR | Status: DC | PRN
  Administered 2011-08-03: 4 mg via INTRAVENOUS
  Filled 2011-08-03: qty 2

## 2011-08-03 MED ORDER — SCOPOLAMINE 1 MG/3DAYS TD PT72
MEDICATED_PATCH | TRANSDERMAL | Status: AC
  Filled 2011-08-03: qty 1

## 2011-08-03 MED ORDER — SENNOSIDES-DOCUSATE SODIUM 8.6-50 MG PO TABS
2.0000 | ORAL_TABLET | Freq: Every day | ORAL | Status: DC
  Administered 2011-08-03 – 2011-08-04 (×2): 2 via ORAL

## 2011-08-03 MED ORDER — TETANUS-DIPHTH-ACELL PERTUSSIS 5-2.5-18.5 LF-MCG/0.5 IM SUSP: 0.5000 mL | Freq: Once | INTRAMUSCULAR | Status: DC

## 2011-08-03 MED ORDER — PANTOPRAZOLE SODIUM 40 MG PO TBEC
40.0000 mg | DELAYED_RELEASE_TABLET | Freq: Every day | ORAL | Status: DC
  Administered 2011-08-04: 40 mg via ORAL
  Filled 2011-08-03 (×4): qty 1

## 2011-08-03 MED ORDER — OMEPRAZOLE MAGNESIUM 20 MG PO TBEC: 20.0000 mg | DELAYED_RELEASE_TABLET | Freq: Every day | ORAL | Status: DC

## 2011-08-03 MED ORDER — SODIUM CHLORIDE 0.9 % IJ SOLN: 3.0000 mL | INTRAMUSCULAR | Status: DC | PRN

## 2011-08-03 MED ORDER — DIPHENHYDRAMINE HCL 50 MG/ML IJ SOLN: 12.5000 mg | INTRAMUSCULAR | Status: DC | PRN

## 2011-08-03 MED ORDER — ONDANSETRON HCL 4 MG/2ML IJ SOLN: 4.0000 mg | Freq: Three times a day (TID) | INTRAMUSCULAR | Status: DC | PRN

## 2011-08-03 MED ORDER — ONDANSETRON HCL 4 MG/2ML IJ SOLN
INTRAMUSCULAR | Status: DC | PRN
  Administered 2011-08-03: 4 mg via INTRAVENOUS

## 2011-08-03 MED ORDER — MEPERIDINE HCL 25 MG/ML IJ SOLN
6.2500 mg | INTRAMUSCULAR | Status: DC | PRN
  Administered 2011-08-03: 6.25 mg via INTRAVENOUS

## 2011-08-03 MED ORDER — PROMETHAZINE HCL 25 MG/ML IJ SOLN: 6.2500 mg | INTRAMUSCULAR | Status: DC | PRN

## 2011-08-03 MED ORDER — CITRIC ACID-SODIUM CITRATE 334-500 MG/5ML PO SOLN
ORAL | Status: AC
  Filled 2011-08-03: qty 15

## 2011-08-03 MED ORDER — FENTANYL CITRATE 0.05 MG/ML IJ SOLN
INTRAMUSCULAR | Status: DC | PRN
  Administered 2011-08-03: 25 ug via INTRATHECAL

## 2011-08-03 MED ORDER — SIMETHICONE 80 MG PO CHEW
80.0000 mg | CHEWABLE_TABLET | Freq: Three times a day (TID) | ORAL | Status: DC
  Administered 2011-08-03 – 2011-08-06 (×8): 80 mg via ORAL

## 2011-08-03 MED ORDER — WITCH HAZEL-GLYCERIN EX PADS: 1.0000 "application " | MEDICATED_PAD | CUTANEOUS | Status: DC | PRN

## 2011-08-03 MED ORDER — METHYLERGONOVINE MALEATE 0.2 MG PO TABS: 0.2000 mg | ORAL_TABLET | ORAL | Status: DC | PRN

## 2011-08-03 MED ORDER — BUPIVACAINE IN DEXTROSE 0.75-8.25 % IT SOLN
INTRATHECAL | Status: DC | PRN
  Administered 2011-08-03: 1.6 mL via INTRATHECAL

## 2011-08-03 MED ORDER — FAMOTIDINE IN NACL 20-0.9 MG/50ML-% IV SOLN
20.0000 mg | Freq: Once | INTRAVENOUS | Status: AC
  Administered 2011-08-03: 20 mg via INTRAVENOUS
  Filled 2011-08-03: qty 50

## 2011-08-03 MED ORDER — DIBUCAINE 1 % RE OINT: 1.0000 "application " | TOPICAL_OINTMENT | RECTAL | Status: DC | PRN

## 2011-08-03 MED ORDER — MEASLES, MUMPS & RUBELLA VAC ~~LOC~~ INJ
0.5000 mL | INJECTION | Freq: Once | SUBCUTANEOUS | Status: DC
  Filled 2011-08-03: qty 0.5

## 2011-08-03 MED ORDER — ONDANSETRON HCL 4 MG PO TABS: 4.0000 mg | ORAL_TABLET | ORAL | Status: DC | PRN

## 2011-08-03 MED ORDER — ACETAMINOPHEN 325 MG PO TABS: 325.0000 mg | ORAL_TABLET | ORAL | Status: DC | PRN

## 2011-08-03 MED ORDER — BISACODYL 10 MG RE SUPP: 10.0000 mg | Freq: Every day | RECTAL | Status: DC | PRN

## 2011-08-03 MED ORDER — KETOROLAC TROMETHAMINE 30 MG/ML IJ SOLN
INTRAMUSCULAR | Status: AC
  Filled 2011-08-03: qty 1

## 2011-08-03 MED ORDER — OXYCODONE-ACETAMINOPHEN 5-325 MG PO TABS
1.0000 | ORAL_TABLET | ORAL | Status: DC | PRN
  Administered 2011-08-03 – 2011-08-05 (×4): 1 via ORAL
  Administered 2011-08-05: 2 via ORAL
  Administered 2011-08-05 – 2011-08-06 (×4): 1 via ORAL
  Filled 2011-08-03: qty 2
  Filled 2011-08-03: qty 1
  Filled 2011-08-03: qty 2
  Filled 2011-08-03 (×6): qty 1

## 2011-08-03 MED ORDER — 0.9 % SODIUM CHLORIDE (POUR BTL) OPTIME
TOPICAL | Status: DC | PRN
  Administered 2011-08-03: 1000 mL

## 2011-08-03 MED ORDER — DIPHENHYDRAMINE HCL 50 MG/ML IJ SOLN: 25.0000 mg | INTRAMUSCULAR | Status: DC | PRN

## 2011-08-03 MED ORDER — METOCLOPRAMIDE HCL 5 MG/ML IJ SOLN: 10.0000 mg | Freq: Three times a day (TID) | INTRAMUSCULAR | Status: DC | PRN

## 2011-08-03 MED ORDER — DIPHENHYDRAMINE HCL 25 MG PO CAPS: 25.0000 mg | ORAL_CAPSULE | Freq: Four times a day (QID) | ORAL | Status: DC | PRN

## 2011-08-03 MED ORDER — BUTORPHANOL TARTRATE 2 MG/ML IJ SOLN
1.0000 mg | INTRAMUSCULAR | Status: DC | PRN
  Administered 2011-08-03: 1 mg via INTRAVENOUS
  Filled 2011-08-03: qty 1

## 2011-08-03 MED ORDER — OXYTOCIN 20 UNITS IN LACTATED RINGERS INFUSION - SIMPLE
125.0000 mL/h | INTRAVENOUS | Status: AC
  Administered 2011-08-03: 125 mL/h via INTRAVENOUS

## 2011-08-03 MED ORDER — SODIUM CHLORIDE 0.9 % IV SOLN
1.0000 ug/kg/h | INTRAVENOUS | Status: DC | PRN
  Filled 2011-08-03: qty 2.5

## 2011-08-03 MED ORDER — SCOPOLAMINE 1 MG/3DAYS TD PT72
1.0000 | MEDICATED_PATCH | Freq: Once | TRANSDERMAL | Status: DC
  Administered 2011-08-03: 1.5 mg via TRANSDERMAL

## 2011-08-03 MED ORDER — NALOXONE HCL 0.4 MG/ML IJ SOLN: 0.4000 mg | INTRAMUSCULAR | Status: DC | PRN

## 2011-08-03 MED ORDER — PRENATAL MULTIVITAMIN CH
1.0000 | ORAL_TABLET | Freq: Every day | ORAL | Status: DC
  Administered 2011-08-03 – 2011-08-06 (×3): 1 via ORAL
  Filled 2011-08-03 (×3): qty 1

## 2011-08-03 MED ORDER — SIMETHICONE 80 MG PO CHEW: 80.0000 mg | CHEWABLE_TABLET | ORAL | Status: DC | PRN

## 2011-08-03 MED ORDER — ACETAMINOPHEN 10 MG/ML IV SOLN
1000.0000 mg | Freq: Four times a day (QID) | INTRAVENOUS | Status: AC | PRN
  Filled 2011-08-03: qty 100

## 2011-08-03 MED ORDER — IBUPROFEN 600 MG PO TABS
600.0000 mg | ORAL_TABLET | Freq: Four times a day (QID) | ORAL | Status: DC | PRN
  Filled 2011-08-03 (×7): qty 1

## 2011-08-03 MED ORDER — CEFAZOLIN SODIUM 1-5 GM-% IV SOLN
INTRAVENOUS | Status: DC | PRN
  Administered 2011-08-03: 2 g via INTRAVENOUS

## 2011-08-03 SURGICAL SUPPLY — 34 items
CHLORAPREP W/TINT 26ML (MISCELLANEOUS) ×2 IMPLANT
CLOTH BEACON ORANGE TIMEOUT ST (SAFETY) ×2 IMPLANT
DRESSING TELFA 8X3 (GAUZE/BANDAGES/DRESSINGS) ×2 IMPLANT
ELECT REM PT RETURN 9FT ADLT (ELECTROSURGICAL) ×2
ELECTRODE REM PT RTRN 9FT ADLT (ELECTROSURGICAL) ×1 IMPLANT
EXTRACTOR VACUUM BELL STYLE (SUCTIONS) IMPLANT
GAUZE SPONGE 4X4 12PLY STRL LF (GAUZE/BANDAGES/DRESSINGS) ×2 IMPLANT
GLOVE BIO SURGEON STRL SZ7 (GLOVE) ×2 IMPLANT
GLOVE BIOGEL PI IND STRL 7.5 (GLOVE) ×1 IMPLANT
GLOVE BIOGEL PI INDICATOR 7.5 (GLOVE) ×1
GLOVE SKINSENSE NS SZ7.0 (GLOVE) ×3
GLOVE SKINSENSE STRL SZ7.0 (GLOVE) ×3 IMPLANT
GOWN PREVENTION PLUS LG XLONG (DISPOSABLE) ×6 IMPLANT
KIT ABG SYR 3ML LUER SLIP (SYRINGE) IMPLANT
NEEDLE HYPO 25X5/8 SAFETYGLIDE (NEEDLE) IMPLANT
NS IRRIG 1000ML POUR BTL (IV SOLUTION) ×2 IMPLANT
PACK C SECTION WH (CUSTOM PROCEDURE TRAY) ×2 IMPLANT
PAD ABD 7.5X8 STRL (GAUZE/BANDAGES/DRESSINGS) ×2 IMPLANT
RETRACTOR WND ALEXIS 25 LRG (MISCELLANEOUS) ×1 IMPLANT
RTRCTR WOUND ALEXIS 25CM LRG (MISCELLANEOUS) ×2
SLEEVE SCD COMPRESS KNEE MED (MISCELLANEOUS) ×2 IMPLANT
STAPLER VISISTAT 35W (STAPLE) ×2 IMPLANT
SUT MNCRL 0 VIOLET CTX 36 (SUTURE) ×3 IMPLANT
SUT MONOCRYL 0 CTX 36 (SUTURE) ×3
SUT PDS AB 0 CTX 60 (SUTURE) IMPLANT
SUT PLAIN 2 0 XLH (SUTURE) ×2 IMPLANT
SUT VIC AB 0 CT1 27 (SUTURE) ×2
SUT VIC AB 0 CT1 27XBRD ANBCTR (SUTURE) ×2 IMPLANT
SUT VIC AB 2-0 CT1 27 (SUTURE) ×2
SUT VIC AB 2-0 CT1 TAPERPNT 27 (SUTURE) ×2 IMPLANT
TOWEL OR 17X24 6PK STRL BLUE (TOWEL DISPOSABLE) ×4 IMPLANT
TRAY FOLEY BAG SILVER LF 14FR (CATHETERS) ×2 IMPLANT
TRAY FOLEY CATH 14FR (SET/KITS/TRAYS/PACK) IMPLANT
WATER STERILE IRR 1000ML POUR (IV SOLUTION) IMPLANT

## 2011-08-03 NOTE — Anesthesia Postprocedure Evaluation (Signed)
  Anesthesia Post-op Note  Patient: Mackenzie Khan  Procedure(s) Performed: Procedure(s) (LRB): CESAREAN SECTION (N/A)  Patient Location: [facility]  Anesthesia Type: Spinal  Level of Consciousness: awake, alert  and oriented  Airway and Oxygen Therapy: Patient Spontanous Breathing  Post-op Pain: none  Post-op Assessment: Post-op Vital signs reviewed, Patient's Cardiovascular Status Stable, No headache, No backache, No residual numbness and No residual motor weakness  Post-op Vital Signs: Reviewed and stable  Complications: No apparent anesthesia complications

## 2011-08-03 NOTE — H&P (Signed)
36 y.o.  [redacted]w[redacted]d    [id number] comes in for labor; ctxes are getting unbearable and she was for a repeat cesarean section at term.  Patient has good fetal movement and no bleeding.    Past Medical History  Diagnosis Date  . Depression   . GERD (gastroesophageal reflux disease)     pregnancy related-pepcid/prilosec    Past Surgical History  Procedure Date  . Cesarean section   . Tab 2007  . Wrist fracture surgery     OB History    Grav Para Term Preterm Abortions TAB SAB Ect Mult Living   5 1 1  0 3 1 2  1 2      # Outc Date GA Lbr Len/2nd Wgt Sex Del Anes PTL Lv   1 TAB 9/07           2 SAB 11/08           3 SAB 8/09           4 TRM 6/10    F CS   Yes   5 CUR               History   Social History  . Marital Status: Married    Spouse Name: N/A    Number of Children: N/A  . Years of Education: N/A   Occupational History  . Not on file.   Social History Main Topics  . Smoking status: Never Smoker   . Smokeless tobacco: Not on file  . Alcohol Use: No  . Drug Use: No  . Sexually Active: Yes   Other Topics Concern  . Not on file   Social History Narrative  . No narrative on file   Latex and Sulfa antibiotics   Prenatal Course: Uncomplicated; was on delalutin secondary previous preterm delivery.  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  Her first trimester screen was no increased risk of Downs.  Filed Vitals:   08/03/11 0036  BP: 131/83  Pulse: 68  Temp:   Resp: 20     Lungs/Cor:  NAD Abdomen:  soft, gravid Ex:  no cords, erythema SVE:  Changed to 2-3/90/-2 FHTs:  120s, good svt, nst r  A/P   For repeat cesarean sectionat term.  All risks, benefits and alternatives discussed with patient and she desires to proceed.  BOWI.  Peggy Monk A

## 2011-08-03 NOTE — Anesthesia Procedure Notes (Signed)
Spinal  Patient location during procedure: OR Start time: 08/03/2011 1:45 AM Staffing Anesthesiologist: Brayton Caves R Performed by: anesthesiologist  Preanesthetic Checklist Completed: patient identified, site marked, surgical consent, pre-op evaluation, timeout performed, IV checked, risks and benefits discussed and monitors and equipment checked Spinal Block Patient position: sitting Prep: DuraPrep Patient monitoring: heart rate, cardiac monitor, continuous pulse ox and blood pressure Approach: midline Location: L3-4 Injection technique: single-shot Needle Needle type: Sprotte  Needle gauge: 24 G Needle length: 9 cm Assessment Sensory level: T4 Additional Notes Patient identified.  Risk benefits discussed including failed block, incomplete pain control, headache, nerve damage, paralysis, blood pressure changes, nausea, vomiting, reactions to medication both toxic or allergic, and postpartum back pain.  Patient expressed understanding and wished to proceed.  All questions were answered.  Sterile technique used throughout procedure.  CSF was clear.  No parasthesia or other complications.  Please see nursing notes for vital signs.

## 2011-08-03 NOTE — Brief Op Note (Signed)
08/02/2011 - 08/03/2011  2:53 AM  PATIENT:  Mackenzie Khan  36 y.o. female  PRE-OPERATIVE DIAGNOSIS:  Previous Cesarean Section, Labor  POST-OPERATIVE DIAGNOSIS:  Previous Cesarean Section, Labor, Breech  PROCEDURE:  Procedure(s) (LRB): CESAREAN SECTION (N/A)  SURGEON:  Surgeon(s) and Role:    * Loney Laurence, MD - Primary  PHYSICIAN ASSISTANT:   ASSISTANTS: none   ANESTHESIA:   spinal  EBL:  Total I/O In: 2800 [I.V.:2800] Out: 650 [Urine:50; Blood:600]  SPECIMEN:  No Specimen  DISPOSITION OF SPECIMEN:  N/A  COUNTS:  YES  PLAN OF CARE: Admit to inpatient   PATIENT DISPOSITION:  PACU - hemodynamically stable.   Delay start of Pharmacological VTE agent (>24hrs) due to surgical blood loss or risk of bleeding: not applicable  Complications:  none Medications:  Ancef, Pitocin Findings:  Baby female, Apgars not recorded- baby vigorous skin to skin in OR, weight P.   Normal tubes, ovaries and uterus seen.  Technique:  After adequate spinal anesthesia was achieved, the patient was prepped and draped in usual sterile fashion.  A foley catheter was used to drain the bladder.  A pfannanstiel incision was made with the scalpel and carried down to the fascia with the bovie cautery. The fascia was incised in the midline with the scalpel and carried in a transverse curvilinear manner bilaterally.  The fascia was reflected superiorly and inferiorly off the rectus muscles and the muscles split in the midline.  A bowel free portion of the peritoneum was entered bluntly and then extended in a superior and inferior manner with good visualization of the bowel and bladder.  The Alexis instrument was then placed and the vesico-uterine fascia tented up and incised in a transverse curvilinear manner.  A 2 cm incision was made in the upper portion of the lower uterine segment until the amnion was exposed.  clear fluid was noted and the baby delivered in the frank breech presentation without  complication.  The baby was bulb suctioned and the cord was clamped and cut.  The baby was then handed to awaiting Neonatology.  The placenta was then delivered manually and the uterus cleared of all debris.  The uterine incision was then closed with a running lock stitch of 0 monocryl.  An imbricating layer of 0 monocryl was closed as well. Good hemostasis of the uterine incision was achieved, the abdomen was cleared with irrigation and the peritoneum was closed with a running stitch of 2-0 vicryl.  This incorporated the rectus muscles as a separate layer.  The fascia was then closed with a running stitch of 0 vicryl.  The subcutaneous layer was closed with interrupted  stitches of 2-0 plain gut.  The skin was closed with staples.  The patient tolerated the procedure well and was returned to the recovery room in stable condition.  All counts were correct times three.  Mackenzie Khan A

## 2011-08-03 NOTE — Op Note (Signed)
08/02/2011 - 08/03/2011  2:53 AM  PATIENT:  Mackenzie Khan  36 y.o. female  PRE-OPERATIVE DIAGNOSIS:  Previous Cesarean Section, Labor  POST-OPERATIVE DIAGNOSIS:  Previous Cesarean Section, Labor, Breech  PROCEDURE:  Procedure(s) (LRB): CESAREAN SECTION (N/A)  SURGEON:  Surgeon(s) and Role:    * Loney Laurence, MD - Primary  PHYSICIAN ASSISTANT:   ASSISTANTS: none   ANESTHESIA:   spinal  EBL:  Total I/O In: 2800 [I.V.:2800] Out: 650 [Urine:50; Blood:600]  SPECIMEN:  No Specimen  DISPOSITION OF SPECIMEN:  N/A  COUNTS:  YES  PLAN OF CARE: Admit to inpatient   PATIENT DISPOSITION:  PACU - hemodynamically stable.   Delay start of Pharmacological VTE agent (>24hrs) due to surgical blood loss or risk of bleeding: not applicable  Complications:  none Medications:  Ancef, Pitocin Findings:  Baby female, Apgars not recorded but baby skin to skin in Or, weight P.   Normal tubes, ovaries and uterus seen.  Technique:  After adequate spinal anesthesia was achieved, the patient was prepped and draped in usual sterile fashion.  A foley catheter was used to drain the bladder.  A pfannanstiel incision was made with the scalpel and carried down to the fascia with the bovie cautery. The fascia was incised in the midline with the scalpel and carried in a transverse curvilinear manner bilaterally.  The fascia was reflected superiorly and inferiorly off the rectus muscles and the muscles split in the midline.  A bowel free portion of the peritoneum was entered bluntly and then extended in a superior and inferior manner with good visualization of the bowel and bladder.  The Alexis instrument was then placed and the vesico-uterine fascia tented up and incised in a transverse curvilinear manner.  A 2 cm incision was made in the upper portion of the lower uterine segment until the amnion was exposed.  clear fluid was noted and the baby delivered in the frank breech presentation without  complication.  The baby was bulb suctioned and the cord was clamped and cut.  The baby was then handed to awaiting Neonatology.  The placenta was then delivered manually and the uterus cleared of all debris.  The uterine incision was then closed with a running lock stitch of 0 monocryl.  An imbricating layer of 0 monocryl was closed as well. Good hemostasis of the uterine incision was achieved, the abdomen was cleared with irrigation and the peritoneum was closed with a running stitch of 2-0 vicryl.  This incorporated the rectus muscles as a separate layer.  The fascia was then closed with a running stitch of 0 vicryl.  The subcutaneous layer was closed with interrupted  stitches of 2-0 plain gut.  The skin was closed with staples.  The patient tolerated the procedure well and was returned to the recovery room in stable condition.  All counts were correct times three.  Stacee Earp A

## 2011-08-03 NOTE — Anesthesia Postprocedure Evaluation (Signed)
Anesthesia Post Note  Patient: Mackenzie Khan  Procedure(s) Performed: Procedure(s) (LRB): CESAREAN SECTION (N/A)  Anesthesia type: Spinal  Patient location: PACU  Post pain: Pain level controlled  Post assessment: Post-op Vital signs reviewed  Last Vitals:  Filed Vitals:   08/03/11 0245  BP: 106/72  Pulse: 61  Temp: 36.8 C  Resp: 14    Post vital signs: Reviewed  Level of consciousness: awake  Complications: No apparent anesthesia complications

## 2011-08-03 NOTE — Transfer of Care (Signed)
Immediate Anesthesia Transfer of Care Note  Patient: Mackenzie Khan  Procedure(s) Performed: Procedure(s) (LRB): CESAREAN SECTION (N/A)  Patient Location: PACU  Anesthesia Type: Spinal  Level of Consciousness: awake, alert  and oriented  Airway & Oxygen Therapy: Patient Spontanous Breathing  Post-op Assessment: Report given to PACU RN  Post vital signs: Reviewed and stable  Complications: No apparent anesthesia complications

## 2011-08-03 NOTE — Anesthesia Preprocedure Evaluation (Signed)
Anesthesia Evaluation  Patient identified by MRN, date of birth, ID band Patient awake    Reviewed: Allergy & Precautions, H&P , NPO status , Patient's Chart, lab work & pertinent test results  Airway Mallampati: III      Dental No notable dental hx.    Pulmonary neg pulmonary ROS,  breath sounds clear to auscultation  Pulmonary exam normal       Cardiovascular Exercise Tolerance: Good negative cardio ROS  Rhythm:regular Rate:Normal     Neuro/Psych PSYCHIATRIC DISORDERS negative neurological ROS  negative psych ROS   GI/Hepatic negative GI ROS, Neg liver ROS, GERD-  ,  Endo/Other  negative endocrine ROSMorbid obesity  Renal/GU negative Renal ROS  negative genitourinary   Musculoskeletal   Abdominal Normal abdominal exam  (+)   Peds  Hematology negative hematology ROS (+)   Anesthesia Other Findings   Reproductive/Obstetrics (+) Pregnancy                           Anesthesia Physical  Anesthesia Plan  ASA: III and Emergent  Anesthesia Plan: Spinal   Post-op Pain Management:    Induction:   Airway Management Planned:   Additional Equipment:   Intra-op Plan:   Post-operative Plan:   Informed Consent: I have reviewed the patients History and Physical, chart, labs and discussed the procedure including the risks, benefits and alternatives for the proposed anesthesia with the patient or authorized representative who has indicated his/her understanding and acceptance.     Plan Discussed with: Anesthesiologist, CRNA and Surgeon  Anesthesia Plan Comments:         Anesthesia Quick Evaluation

## 2011-08-03 NOTE — Addendum Note (Signed)
Addendum  created 08/03/11 0800 by Shanon Payor, CRNA   Modules edited:Notes Section

## 2011-08-04 LAB — CBC
MCH: 30.4 pg (ref 26.0–34.0)
MCHC: 33.6 g/dL (ref 30.0–36.0)
MCV: 90.6 fL (ref 78.0–100.0)
Platelets: 224 10*3/uL (ref 150–400)
RBC: 3.81 MIL/uL — ABNORMAL LOW (ref 3.87–5.11)
RDW: 13.7 % (ref 11.5–15.5)

## 2011-08-04 NOTE — Progress Notes (Signed)
Subjective: Postpartum Day 1: Cesarean Delivery Patient reports that pain is controlled with oral pain medication. Ambulating well. No nausea  Objective: Vital signs in last 24 hours: Temp:  [97.7 F (36.5 C)-98.6 F (37 C)] 98.3 F (36.8 C) (03/28 0600) Pulse Rate:  [65-86] 65  (03/28 0600) Resp:  [18-20] 20  (03/28 0600) BP: (101-111)/(64-72) 105/67 mmHg (03/28 0600) SpO2:  [94 %-98 %] 98 % (03/28 0030)  Physical Exam:  General: alert, cooperative and appears stated age Lochia: appropriate Uterine Fundus: firm Incision: dressing c/d/i DVT Evaluation: No evidence of DVT seen on physical exam.   Basename 08/04/11 0515 08/03/11 0115  HGB 11.6* 14.2  HCT 34.5* 40.0    Assessment/Plan: Status post Cesarean section. Doing well postoperatively.  Continue current care. Desires neonatal circ. R/B/A reviewed.  Infant breast feeding poorly. Lactation requests deferment of circ until tomorrow.  Manolo Bosket H. 08/04/2011, 12:29 PM

## 2011-08-05 NOTE — Progress Notes (Signed)
Post Op Day 2 Subjective: no complaints, up ad lib, voiding and + flatus  Objective: Blood pressure 114/76, pulse 60, temperature 97.6 F (36.4 C), breastfeeding.  Physical Exam:  General: alert Lochia: appropriate Uterine Fundus: firm Incision: healing well   Basename 08/04/11 0515 08/03/11 0115  HGB 11.6* 14.2  HCT 34.5* 40.0    Assessment/Plan: Plan for discharge tomorrow   LOS: 3 days   Chaise Passarella D 08/05/2011, 11:11 AM

## 2011-08-06 ENCOUNTER — Encounter (HOSPITAL_COMMUNITY): Payer: Self-pay | Admitting: Obstetrics and Gynecology

## 2011-08-06 ENCOUNTER — Encounter (HOSPITAL_COMMUNITY)
Admission: RE | Admit: 2011-08-06 | Discharge: 2011-08-06 | Disposition: A | Discharge status: Home / Self Care | Payer: BC Managed Care – PPO | Source: Ambulatory Visit | Attending: Obstetrics and Gynecology | Admitting: Obstetrics and Gynecology

## 2011-08-06 DIAGNOSIS — O923 Agalactia: Secondary | ICD-10-CM | POA: Insufficient documentation

## 2011-08-06 MED ORDER — OXYCODONE-ACETAMINOPHEN 5-325 MG PO TABS: 1.0000 | ORAL_TABLET | ORAL | Status: AC | PRN

## 2011-08-06 NOTE — Progress Notes (Signed)
Post Op Day 3 Subjective: up ad lib, voiding, tolerating PO and + flatus  Objective: Blood pressure 100/73, pulse 63, temperature 98 F (36.7 C),  breastfeeding.  Physical Exam:  General: alert Lochia: appropriate Uterine Fundus: firm Incision: healing well   Basename 08/04/11 0515  HGB 11.6*  HCT 34.5*    Assessment/Plan: Discharge home   LOS: 4 days   Aryan Bello D 08/06/2011, 9:13 AM

## 2011-08-06 NOTE — Discharge Summary (Signed)
Obstetric Discharge Summary Reason for Admission: onset of labor, previous cesarean delivery. Prenatal Procedures: ultrasound Intrapartum Procedures: cesarean: low cervical, transverse, repeat. Postpartum Procedures: none Complications-Operative and Postpartum: none HGB  Date Value Range Status  01/10/2007 14.1  11.6-15.9 (g/dL) Final     Hemoglobin  Date Value Range Status  08/04/2011 11.6* 12.0-15.0 (g/dL) Final     DELTA CHECK NOTED     REPEATED TO VERIFY     HCT  Date Value Range Status  08/04/2011 34.5* 36.0-46.0 (%) Final  01/10/2007 40.4  34.8-46.6 (%) Final    Physical Exam:  General: alert Lochia: appropriate Uterine Fundus: firm Incision: healing well  Discharge Diagnoses: Term Pregnancy-delivered and previous cesarean delivery  Discharge Information: Date: 08/06/2011 Activity: Limited Diet: routine Medications: PNV, Ibuprofen and Percocet Condition: stable Instructions: refer to practice specific booklet Discharge to: home Follow-up Information    Follow up with HORVATH,MICHELLE A, MD. Schedule an appointment as soon as possible for a visit in 4 weeks.   Contact information:   [address] [place] [address] [phone]          Newborn Data: Live born female  Birth Weight: 5 lb 14.2 oz (2671 g) APGAR: ,   Home with mother.  Vaiden Adames D 08/06/2011, 9:21 AM

## 2011-08-06 NOTE — Discharge Instructions (Signed)

## 2011-08-12 ENCOUNTER — Inpatient Hospital Stay (HOSPITAL_COMMUNITY)
Admission: RE | Admit: 2011-08-12 | Payer: BC Managed Care – PPO | Source: Ambulatory Visit | Admitting: Obstetrics and Gynecology

## 2011-08-12 ENCOUNTER — Encounter (HOSPITAL_COMMUNITY): Admission: RE | Payer: Self-pay | Source: Ambulatory Visit

## 2011-08-12 ENCOUNTER — Inpatient Hospital Stay (HOSPITAL_COMMUNITY): Admission: RE | Admit: 2011-08-12 | Discharge: 2011-08-12 | Payer: BC Managed Care – PPO | Source: Ambulatory Visit

## 2011-08-12 SURGERY — Surgical Case
Anesthesia: Regional

## 2011-08-12 NOTE — Progress Notes (Signed)
Infant Lactation Consultation Outpatient Visit Note  Patient Name: Mackenzie Khan Date of Birth: [birth year] Birth Weight:   Gestational Age at Delivery: Gestational Age: <None> Type of Delivery:   Breastfeeding History Frequency of Breastfeeding:  Length of Feeding:  Voids:  Stools:   Supplementing / Method: Pumping:  Type of Pump:   Frequency:  Volume:    Comments:    Consultation Evaluation:  Initial Feeding Assessment: Pre-feed Weight: Post-feed Weight: Amount Transferred: Comments:  Additional Feeding Assessment: Pre-feed Weight: Post-feed Weight: Amount Transferred: Comments:  Additional Feeding Assessment: Pre-feed Weight: Post-feed Weight: Amount Transferred: Comments:  Total Breast milk Transferred this Visit:  Total Supplement Given:   Additional Interventions:   Follow-Up  Mom states she has just fed baby - he could not wait until appointment. Reports that baby was seen at [facility] this morning and is gaining weight well. Reports that baby is latching well and feels she is making plenty of milk. No questions at present. Encouraged to call for another appointment if needed or attend the [facility].    Pamelia Hoit 08/12/2011, 5:04 PM

## 2011-09-06 ENCOUNTER — Encounter (HOSPITAL_COMMUNITY)
Admission: RE | Admit: 2011-09-06 | Discharge: 2011-09-06 | Disposition: A | Discharge status: Home / Self Care | Payer: BC Managed Care – PPO | Source: Ambulatory Visit | Attending: Obstetrics and Gynecology | Admitting: Obstetrics and Gynecology

## 2011-09-06 DIAGNOSIS — O923 Agalactia: Secondary | ICD-10-CM | POA: Insufficient documentation

## 2011-10-07 ENCOUNTER — Encounter (HOSPITAL_COMMUNITY)
Admission: RE | Admit: 2011-10-07 | Discharge: 2011-10-07 | Disposition: A | Discharge status: Home / Self Care | Payer: BC Managed Care – PPO | Source: Ambulatory Visit | Attending: Obstetrics and Gynecology | Admitting: Obstetrics and Gynecology

## 2011-10-07 DIAGNOSIS — O923 Agalactia: Secondary | ICD-10-CM | POA: Insufficient documentation

## 2011-11-07 ENCOUNTER — Encounter (HOSPITAL_COMMUNITY)
Admission: RE | Admit: 2011-11-07 | Discharge: 2011-11-07 | Disposition: A | Discharge status: Home / Self Care | Payer: BC Managed Care – PPO | Source: Ambulatory Visit | Attending: Obstetrics and Gynecology | Admitting: Obstetrics and Gynecology

## 2011-11-07 DIAGNOSIS — O923 Agalactia: Secondary | ICD-10-CM | POA: Insufficient documentation

## 2011-12-08 ENCOUNTER — Encounter (HOSPITAL_COMMUNITY)
Admission: RE | Admit: 2011-12-08 | Discharge: 2011-12-08 | Disposition: A | Discharge status: Home / Self Care | Payer: BC Managed Care – PPO | Source: Ambulatory Visit | Attending: Obstetrics and Gynecology | Admitting: Obstetrics and Gynecology

## 2011-12-08 DIAGNOSIS — O923 Agalactia: Secondary | ICD-10-CM | POA: Insufficient documentation

## 2012-01-08 ENCOUNTER — Encounter (HOSPITAL_COMMUNITY)
Admission: RE | Admit: 2012-01-08 | Discharge: 2012-01-08 | Disposition: A | Discharge status: Home / Self Care | Payer: BC Managed Care – PPO | Source: Ambulatory Visit | Attending: Obstetrics and Gynecology | Admitting: Obstetrics and Gynecology

## 2012-01-08 DIAGNOSIS — O923 Agalactia: Secondary | ICD-10-CM | POA: Insufficient documentation

## 2012-02-08 ENCOUNTER — Encounter (HOSPITAL_COMMUNITY)
Admission: RE | Admit: 2012-02-08 | Discharge: 2012-02-08 | Disposition: A | Discharge status: Home / Self Care | Payer: BC Managed Care – PPO | Source: Ambulatory Visit | Attending: Obstetrics and Gynecology | Admitting: Obstetrics and Gynecology

## 2012-02-08 DIAGNOSIS — O923 Agalactia: Secondary | ICD-10-CM | POA: Insufficient documentation

## 2012-03-10 ENCOUNTER — Encounter (HOSPITAL_COMMUNITY)
Admission: RE | Admit: 2012-03-10 | Discharge: 2012-03-10 | Disposition: A | Discharge status: Home / Self Care | Payer: BC Managed Care – PPO | Source: Ambulatory Visit | Attending: Obstetrics and Gynecology | Admitting: Obstetrics and Gynecology

## 2012-03-10 DIAGNOSIS — O923 Agalactia: Secondary | ICD-10-CM | POA: Insufficient documentation

## 2012-04-09 ENCOUNTER — Encounter (HOSPITAL_COMMUNITY)
Admission: RE | Admit: 2012-04-09 | Discharge: 2012-04-09 | Disposition: A | Discharge status: Home / Self Care | Payer: BC Managed Care – PPO | Source: Ambulatory Visit | Attending: Obstetrics and Gynecology | Admitting: Obstetrics and Gynecology

## 2012-04-09 DIAGNOSIS — O923 Agalactia: Secondary | ICD-10-CM | POA: Insufficient documentation

## 2012-05-10 ENCOUNTER — Encounter (HOSPITAL_COMMUNITY)
Admission: RE | Admit: 2012-05-10 | Discharge: 2012-05-10 | Disposition: A | Discharge status: Home / Self Care | Payer: BC Managed Care – PPO | Source: Ambulatory Visit | Attending: Obstetrics and Gynecology | Admitting: Obstetrics and Gynecology

## 2012-05-10 DIAGNOSIS — O923 Agalactia: Secondary | ICD-10-CM | POA: Insufficient documentation

## 2012-06-10 ENCOUNTER — Encounter (HOSPITAL_COMMUNITY)
Admission: RE | Admit: 2012-06-10 | Discharge: 2012-06-10 | Disposition: A | Discharge status: Home / Self Care | Payer: BC Managed Care – PPO | Source: Ambulatory Visit | Attending: Obstetrics and Gynecology | Admitting: Obstetrics and Gynecology

## 2012-06-10 DIAGNOSIS — O923 Agalactia: Secondary | ICD-10-CM | POA: Insufficient documentation

## 2012-07-09 ENCOUNTER — Encounter (HOSPITAL_COMMUNITY)
Admission: RE | Admit: 2012-07-09 | Discharge: 2012-07-09 | Disposition: A | Discharge status: Home / Self Care | Payer: BC Managed Care – PPO | Source: Ambulatory Visit | Attending: Obstetrics and Gynecology | Admitting: Obstetrics and Gynecology

## 2012-07-09 DIAGNOSIS — O923 Agalactia: Secondary | ICD-10-CM | POA: Insufficient documentation

## 2012-08-09 ENCOUNTER — Encounter (HOSPITAL_COMMUNITY)
Admission: RE | Admit: 2012-08-09 | Discharge: 2012-08-09 | Disposition: A | Discharge status: Home / Self Care | Payer: BC Managed Care – PPO | Source: Ambulatory Visit | Attending: Obstetrics and Gynecology | Admitting: Obstetrics and Gynecology

## 2012-08-09 DIAGNOSIS — O923 Agalactia: Secondary | ICD-10-CM | POA: Insufficient documentation

## 2012-09-09 ENCOUNTER — Encounter (HOSPITAL_COMMUNITY)
Admission: RE | Admit: 2012-09-09 | Discharge: 2012-09-09 | Disposition: A | Discharge status: Home / Self Care | Payer: BC Managed Care – PPO | Source: Ambulatory Visit | Attending: Obstetrics and Gynecology | Admitting: Obstetrics and Gynecology

## 2012-09-09 DIAGNOSIS — O923 Agalactia: Secondary | ICD-10-CM | POA: Insufficient documentation

## 2013-02-19 ENCOUNTER — Other Ambulatory Visit: Payer: Self-pay | Admitting: Obstetrics and Gynecology

## 2014-03-04 ENCOUNTER — Other Ambulatory Visit: Payer: Self-pay | Admitting: Obstetrics and Gynecology

## 2014-03-05 LAB — CYTOLOGY - PAP

## 2014-03-10 ENCOUNTER — Encounter (HOSPITAL_COMMUNITY): Payer: Self-pay | Admitting: Obstetrics and Gynecology

## 2014-06-18 ENCOUNTER — Ambulatory Visit (INDEPENDENT_AMBULATORY_CARE_PROVIDER_SITE_OTHER): Payer: 59 | Admitting: Podiatry

## 2014-06-18 ENCOUNTER — Encounter: Payer: Self-pay | Admitting: Podiatry

## 2014-06-18 VITALS — BP 134/74 | HR 59 | Resp 16

## 2014-06-18 DIAGNOSIS — B351 Tinea unguium: Secondary | ICD-10-CM

## 2014-06-18 LAB — HEPATIC FUNCTION PANEL
ALK PHOS: 33 U/L — AB (ref 39–117)
ALT: 24 U/L (ref 0–35)
AST: 23 U/L (ref 0–37)
Albumin: 3.8 g/dL (ref 3.5–5.2)
BILIRUBIN TOTAL: 0.5 mg/dL (ref 0.2–1.2)
Bilirubin, Direct: 0.1 mg/dL (ref 0.0–0.3)
Indirect Bilirubin: 0.4 mg/dL (ref 0.2–1.2)
Total Protein: 6.3 g/dL (ref 6.0–8.3)

## 2014-06-18 MED ORDER — TERBINAFINE HCL 250 MG PO TABS: 250.0000 mg | ORAL_TABLET | Freq: Every day | ORAL | Status: DC

## 2014-06-18 NOTE — Progress Notes (Signed)
Subjective:     Patient ID: Davy PiqueLauren C Berkheimer, female   DOB: 07/20/1975, 39 y.o.   MRN: [id number]  HPI patient presents with thick yellow nails of a year duration hallux bilateral third left and states that she has a significant family history of this condition. Patient does not remember specific trauma but feels like she had a pedicure and that's when it occurred   Review of Systems  All other systems reviewed and are negative.      Objective:   Physical Exam  Constitutional: She is oriented to person, place, and time.  Cardiovascular: Intact distal pulses.   Musculoskeletal: Normal range of motion.  Neurological: She is oriented to person, place, and time.  Skin: Skin is warm.  Nursing note and vitals reviewed.  neurovascular status intact with muscle strength adequate and range of motion subtalar midtarsal joint within normal limits. Patient's noted to have yellow discoloration hallux nails bilateral third left that are thick and do show multiple signs of fungal infection. Patient's digits are well-perfused and she's well oriented 3     Assessment:     Significant family history mycotic nail infection with 3 nails that appear to be involved    Plan:     Reviewed correction and at this time I recommended a combined approach due to her young age long-term history and family history. We will start her on oral medication for 90 days after blood work is complete topical medicine consisting of formula 3 and laser. Also we will have her use ultraviolet shoehorned. Scheduled for laser and Lamisil to her 50 mg daily 90 days will be prescribed for this patient

## 2014-06-18 NOTE — Progress Notes (Signed)
   Subjective:    Patient ID: Mackenzie PiqueLauren C Facundo, female    DOB: 08/23/1975, 39 y.o.   MRN: [id number]  HPI Comments: "I have toe fungus"  Patient c/o thick, discolored nails since 2008. She noticed it had started after getting a pedicure. Tried OTC meds-no help.     Review of Systems  All other systems reviewed and are negative.      Objective:   Physical Exam        Assessment & Plan:

## 2014-06-18 NOTE — Patient Instructions (Signed)

## 2014-07-03 ENCOUNTER — Encounter: Payer: Self-pay | Admitting: Podiatry

## 2014-07-03 ENCOUNTER — Ambulatory Visit (INDEPENDENT_AMBULATORY_CARE_PROVIDER_SITE_OTHER): Payer: 59 | Admitting: Podiatry

## 2014-07-03 VITALS — BP 101/70 | HR 76 | Resp 12

## 2014-07-03 DIAGNOSIS — B351 Tinea unguium: Secondary | ICD-10-CM

## 2014-07-03 NOTE — Progress Notes (Signed)
Subjective:     Patient ID: Mackenzie PiqueLauren C Khan, female   DOB: 10/14/1975, 39 y.o.   MRN: [id number]  HPI patient presents stating I'm here to get these nails lasered   Review of Systems     Objective:   Physical Exam Neurovascular status intact with thick hallux nails bilateral third left and a small amount of fungus second right    Assessment:     Mycosis bilateral    Plan:     Discussed that I want her to start oral Lamisil and I did do these for nails with laser which was tolerated well by patient approximately 2000 pulses

## 2014-07-15 DIAGNOSIS — B351 Tinea unguium: Secondary | ICD-10-CM

## 2014-08-13 ENCOUNTER — Ambulatory Visit: Payer: 59 | Admitting: Podiatry

## 2014-08-25 ENCOUNTER — Ambulatory Visit (INDEPENDENT_AMBULATORY_CARE_PROVIDER_SITE_OTHER): Payer: 59 | Admitting: Podiatry

## 2014-08-25 DIAGNOSIS — B351 Tinea unguium: Secondary | ICD-10-CM

## 2014-08-25 NOTE — Progress Notes (Signed)
Subjective:     Patient ID: Mackenzie PiqueLauren C Khan, female   DOB: 09/18/1975, 39 y.o.   MRN: [id number]  HPI patient states that her nails seem to be some improved with laser and Lamisil   Review of Systems     Objective:   Physical Exam Neurovascular status intact muscle strength adequate with patient showing discoloration of the hallux nails bilateral right over left    Assessment:     Mycosis of nail still present but appears to be improving    Plan:     Laser applied approximately 2000 pulses 1000 each big toe and reappoint in 4 months

## 2014-10-23 ENCOUNTER — Telehealth: Payer: Self-pay | Admitting: *Deleted

## 2014-10-23 NOTE — Telephone Encounter (Signed)
Pt asked if she could get a refill of the antifungal polish.  I left message informing pt she could pick up the medication from the back reception area.

## 2014-10-24 DIAGNOSIS — B351 Tinea unguium: Secondary | ICD-10-CM

## 2015-01-26 ENCOUNTER — Ambulatory Visit (INDEPENDENT_AMBULATORY_CARE_PROVIDER_SITE_OTHER): Payer: 59 | Admitting: Podiatry

## 2015-01-26 ENCOUNTER — Encounter: Payer: Self-pay | Admitting: Podiatry

## 2015-01-26 DIAGNOSIS — B351 Tinea unguium: Secondary | ICD-10-CM

## 2015-01-26 NOTE — Progress Notes (Signed)
Subjective:     Patient ID: Mackenzie Khan, female   DOB: 05/30/1975, 39 y.o.   MRN: [id number]  HPI patient presents stating my nails seem somewhat better but the left big toenail seems split   Review of Systems     Objective:   Physical Exam Neurovascular status intact with a split in the left big toenail distal two thirds    Assessment:     Mycotic nails that are improving with some damage and trauma to the underlying nailbed    Plan:     Laser approximate 1500 pulses administered tolerated well and continue topical anti-fungal reappoint as needed

## 2015-03-12 ENCOUNTER — Other Ambulatory Visit: Payer: Self-pay | Admitting: Obstetrics and Gynecology

## 2015-07-30 ENCOUNTER — Encounter: Payer: Self-pay | Admitting: Podiatry

## 2015-07-30 ENCOUNTER — Ambulatory Visit (INDEPENDENT_AMBULATORY_CARE_PROVIDER_SITE_OTHER): Payer: 59 | Admitting: Podiatry

## 2015-07-30 VITALS — BP 93/57 | HR 70 | Resp 16

## 2015-07-30 DIAGNOSIS — B351 Tinea unguium: Secondary | ICD-10-CM

## 2015-07-30 MED ORDER — TERBINAFINE HCL 250 MG PO TABS: ORAL_TABLET | ORAL | Status: DC

## 2015-07-31 NOTE — Progress Notes (Signed)
Subjective:     Patient ID: Mackenzie Khan, female   DOB: 06/18/1975, 40 y.o.   MRN: [id number]  HPI patient presents stating there is been reoccurrence of fungus on the hallux nail left over right and she was concerned and wanted treated   Review of Systems     Objective:   Physical Exam Neurovascular status intact with discoloration on the lateral side of the left and right hallux that is localized in nature    Assessment:     Mycotic nail infection    Plan:     We will start a Lamisil pills pack today and laser was administered 1000 pulses to the nailbeds. Patient will have this repeated again in 1 month

## 2015-08-27 ENCOUNTER — Encounter: Payer: 59 | Admitting: Podiatry

## 2015-08-27 DIAGNOSIS — B351 Tinea unguium: Secondary | ICD-10-CM

## 2015-09-01 NOTE — Progress Notes (Signed)
Subjective:     Patient ID: Mackenzie Khan, female   DOB: 09/26/1975, 40 y.o.   MRN: [id number]  HPI patient presents stating there is been reoccurrence of fungus on the hallux nail left over right and she was concerned and wanted treated   Review of Systems     Objective:   Physical Exam Neurovascular status intact with discoloration on the lateral side of the left and right hallux that is localized in nature    Assessment:     Mycotic nail infection    Plan:     We will start a Lamisil pills pack today and laser was administered 1000 pulses to the nailbeds. Patient will have this repeated again in 1 month

## 2015-09-22 DIAGNOSIS — F3281 Premenstrual dysphoric disorder: Secondary | ICD-10-CM | POA: Insufficient documentation

## 2015-09-22 DIAGNOSIS — M1711 Unilateral primary osteoarthritis, right knee: Secondary | ICD-10-CM | POA: Insufficient documentation

## 2015-09-22 DIAGNOSIS — Z3041 Encounter for surveillance of contraceptive pills: Secondary | ICD-10-CM | POA: Insufficient documentation

## 2015-09-22 DIAGNOSIS — F429 Obsessive-compulsive disorder, unspecified: Secondary | ICD-10-CM | POA: Insufficient documentation

## 2015-09-24 DIAGNOSIS — B351 Tinea unguium: Secondary | ICD-10-CM

## 2015-11-13 ENCOUNTER — Encounter: Payer: Self-pay | Admitting: Podiatry

## 2015-11-13 ENCOUNTER — Ambulatory Visit (INDEPENDENT_AMBULATORY_CARE_PROVIDER_SITE_OTHER): Payer: 59 | Admitting: Podiatry

## 2015-11-13 DIAGNOSIS — B351 Tinea unguium: Secondary | ICD-10-CM | POA: Diagnosis not present

## 2015-11-15 NOTE — Progress Notes (Signed)
Subjective:     Patient ID: Mackenzie Khan, female   DOB: 07/08/1975, 40 y.o.   MRN: [id number]  HPI patient states that she wore polish for almost 2 months and is had some reoccurrence of nail discoloration right over left hallux and also she's having slight problems tolerating the oral antifungal as it bothers her stomach   Review of Systems     Objective:   Physical Exam Neurovascular status intact muscle strength adequate with patient found to have areas of reoccurrence around the distal lateral and medial of the right hallux and slightly on the left. It is localized proved from previous    Assessment:     Continued fungal infection right over left which may be due to wearing polish for to long but time    Plan:     Reviewed this condition and discussed we could use Diflucan at one point in future for oral if needed but went ahead today and did a laser treatment and continue and finish her oral antifungal and  reevaluate again in several months

## 2016-03-24 ENCOUNTER — Other Ambulatory Visit: Payer: Self-pay | Admitting: Obstetrics and Gynecology

## 2016-03-25 LAB — CYTOLOGY - PAP

## 2016-04-26 ENCOUNTER — Encounter (HOSPITAL_COMMUNITY): Payer: Self-pay | Admitting: Family Medicine

## 2016-04-26 ENCOUNTER — Ambulatory Visit (HOSPITAL_COMMUNITY)
Admission: EM | Admit: 2016-04-26 | Discharge: 2016-04-26 | Disposition: A | Discharge status: Home / Self Care | Payer: Managed Care, Other (non HMO) | Attending: Family Medicine | Admitting: Family Medicine

## 2016-04-26 ENCOUNTER — Ambulatory Visit (INDEPENDENT_AMBULATORY_CARE_PROVIDER_SITE_OTHER): Payer: Managed Care, Other (non HMO)

## 2016-04-26 DIAGNOSIS — M25571 Pain in right ankle and joints of right foot: Secondary | ICD-10-CM | POA: Diagnosis not present

## 2016-04-26 MED ORDER — NAPROXEN 500 MG PO TABS: 500.0000 mg | ORAL_TABLET | Freq: Two times a day (BID) | ORAL | 0 refills | Status: DC

## 2016-04-26 NOTE — ED Provider Notes (Signed)
CSN: [id number]     Arrival date & time 04/26/16  1028 History   [id number]    Chief Complaint  Patient presents with  . Ankle Pain   (Consider location/radiation/quality/duration/timing/severity/associated sxs/prior Treatment) Patient c/o right ankle pain after tripping and falling and twisting right ankle.  She has right lateral malleolus pain and is difficult to bear weight.   The history is provided by the patient.  Ankle Pain  Location:  Ankle Injury: yes   Ankle location:  R ankle Pain details:    Quality:  Aching   Radiates to:  Does not radiate   Severity:  Moderate   Onset quality:  Sudden   Duration:  2 hours   Timing:  Constant Chronicity:  New Dislocation: no   Foreign body present:  No foreign bodies Prior injury to area:  No Relieved by:  Nothing Worsened by:  Nothing Ineffective treatments:  None tried Associated symptoms: swelling     Past Medical History:  Diagnosis Date  . Depression   . GERD (gastroesophageal reflux disease)    pregnancy related-pepcid/prilosec   Past Surgical History:  Procedure Laterality Date  . CESAREAN SECTION    . CESAREAN SECTION  08/03/2011   Procedure: CESAREAN SECTION;  Surgeon: Loney LaurenceMichelle A Horvath, MD;  Location: [facility];  Service: Gynecology;  Laterality: N/A;  Repeat Cesarean Section Delivery  Boy  @ 0157,    . tab  2007  . WRIST FRACTURE SURGERY     History reviewed. No pertinent family history. Social History  Substance Use Topics  . Smoking status: Never Smoker  . Smokeless tobacco: Never Used  . Alcohol use No   OB History    Gravida Para Term Preterm AB Living   5 2 2  0 3 3   SAB TAB Ectopic Multiple Live Births   2 1   1 2      Review of Systems  Constitutional: Negative.   HENT: Negative.   Eyes: Negative.   Respiratory: Negative.   Cardiovascular: Negative.   Gastrointestinal: Negative.   Endocrine: Negative.   Genitourinary: Negative.   Musculoskeletal: Positive for arthralgias and joint swelling.   Skin: Negative.   Allergic/Immunologic: Negative.   Neurological: Negative.   Hematological: Negative.   Psychiatric/Behavioral: Negative.     Allergies  Latex and Sulfa antibiotics  Home Medications   Prior to Admission medications   Medication Sig Start Date End Date Taking? Authorizing Provider  folic acid (FOLVITE) 800 MCG tablet Take 3,200 mcg by mouth at bedtime.     Historical Provider, MD  multivitamin-iron-minerals-folic acid (CENTRUM) chewable tablet Chew 1 tablet by mouth daily.    Historical Provider, MD  naproxen (NAPROSYN) 500 MG tablet Take 1 tablet (500 mg total) by mouth 2 (two) times daily with a meal. 04/26/16   Deatra CanterWilliam J Dezmond Downie, FNP  SPRINTEC 28 0.25-35 MG-MCG tablet Take 1 tablet by mouth daily. 07/10/15   Historical Provider, MD  terbinafine (LAMISIL) 250 MG tablet Take one tablet once daily x 7 days, then repeat every month x 4 months 07/30/15   Lenn SinkNorman S Regal, DPM  valACYclovir (VALTREX) 1000 MG tablet Take 1,000 mg by mouth as needed.  05/15/15   Historical Provider, MD   Meds Ordered and Administered this Visit  Medications - No data to display  BP 125/79 (BP Location: Right Arm)   Pulse 75   Temp 98.1 F (36.7 C) (Oral)   Resp 12   LMP 04/12/2016   SpO2 100%  No data found.  Physical Exam  Constitutional: She appears well-developed and well-nourished.  HENT:  Head: Normocephalic and atraumatic.  Mouth/Throat: Oropharynx is clear and moist.  Eyes: Conjunctivae and EOM are normal. Pupils are equal, round, and reactive to light.  Neck: Normal range of motion. Neck supple.  Cardiovascular: Normal rate, regular rhythm and normal heart sounds.   Pulmonary/Chest: Effort normal and breath sounds normal.  Musculoskeletal: She exhibits edema and tenderness.  Right lateral malleolus with swelling and tenderness.  Nursing note and vitals reviewed.   Urgent Care Course   Clinical Course     Procedures (including critical care time)  Labs Review Labs  Reviewed - No data to display  Imaging Review Dg Ankle Complete Right  Result Date: 04/26/2016 CLINICAL DATA:  Injury. EXAM: RIGHT ANKLE - COMPLETE 3+ VIEW COMPARISON:  No recent prior. FINDINGS: No acute bony or joint abnormality identified. No evidence of fracture or dislocation. IMPRESSION: No acute or focal abnormality. Electronically Signed   By: Maisie Fushomas  Register   On: 04/26/2016 11:49     Visual Acuity Review  Right Eye Distance:   Left Eye Distance:   Bilateral Distance:    Right Eye Near:   Left Eye Near:    Bilateral Near:         MDM   1. Acute right ankle pain    Naprosyn 500mg  one po bid x 10 days #20 [place]      Emelyn Roen J Cabria Micalizzi, FNP 04/26/16 1220

## 2016-04-26 NOTE — ED Triage Notes (Signed)
Pt here for right ankle pain after trip and fall this am twisting ankle.

## 2016-06-27 ENCOUNTER — Other Ambulatory Visit: Payer: 59

## 2016-06-27 ENCOUNTER — Ambulatory Visit: Payer: 59 | Admitting: Podiatry

## 2016-06-29 ENCOUNTER — Encounter: Payer: Self-pay | Admitting: Podiatry

## 2016-06-29 ENCOUNTER — Ambulatory Visit: Payer: BLUE CROSS/BLUE SHIELD

## 2016-06-29 ENCOUNTER — Ambulatory Visit (INDEPENDENT_AMBULATORY_CARE_PROVIDER_SITE_OTHER): Payer: BLUE CROSS/BLUE SHIELD | Admitting: Podiatry

## 2016-06-29 VITALS — BP 100/71 | HR 66 | Resp 16

## 2016-06-29 DIAGNOSIS — B351 Tinea unguium: Secondary | ICD-10-CM

## 2016-06-29 MED ORDER — FLUCONAZOLE 150 MG PO TABS: 150.0000 mg | ORAL_TABLET | ORAL | 0 refills | Status: DC

## 2016-06-29 NOTE — Progress Notes (Signed)
Subjective:     Patient ID: Mackenzie Khan, female   DOB: 06/27/1975, 41 y.o.   MRN: [id number]  HPI patient states that she sprained her right ankle and then started to develop some discoloration of the big toenail right on the medial side. She wants to have this treated   Review of Systems     Objective:   Physical Exam Neurovascular status intact negative Homans sign noted with discoloration of the medial side of the right hallux localized in nature with no proximal spread    Assessment:     Fungal infection right hallux    Plan:     Advised on conservative care consisting of laser therapy and Diflucan for one pill a month for 4 weeks. Patient had laser initiated today will be seen back to recheck

## 2016-06-30 NOTE — Progress Notes (Signed)
Pt presents with mycotic infection of nail hallux   All other systems are negative  Laser therapy administered to affected nails and tolerated well. All safety precautions were in place. Re-appointed in 1 month for 2 and final treatment

## 2016-07-27 ENCOUNTER — Ambulatory Visit (INDEPENDENT_AMBULATORY_CARE_PROVIDER_SITE_OTHER): Payer: BLUE CROSS/BLUE SHIELD

## 2016-07-27 DIAGNOSIS — B351 Tinea unguium: Secondary | ICD-10-CM

## 2016-07-27 DIAGNOSIS — R52 Pain, unspecified: Secondary | ICD-10-CM

## 2016-08-10 NOTE — Progress Notes (Signed)
Pt presents with mycotic infection of nail hallux, 90% cleared All other systems are negative  Laser therapy administered to affected nails and tolerated well. All safety precautions were in place. Re-appointed as needed

## 2016-10-07 ENCOUNTER — Ambulatory Visit (INDEPENDENT_AMBULATORY_CARE_PROVIDER_SITE_OTHER): Payer: BLUE CROSS/BLUE SHIELD | Admitting: Podiatry

## 2016-10-07 ENCOUNTER — Encounter: Payer: Self-pay | Admitting: Podiatry

## 2016-10-07 DIAGNOSIS — B351 Tinea unguium: Secondary | ICD-10-CM | POA: Diagnosis not present

## 2016-10-07 DIAGNOSIS — R52 Pain, unspecified: Secondary | ICD-10-CM

## 2016-10-07 MED ORDER — TERBINAFINE HCL 250 MG PO TABS: ORAL_TABLET | ORAL | 0 refills | Status: AC

## 2016-10-07 NOTE — Progress Notes (Signed)
Subjective:    Patient ID: Mackenzie Khan, female   DOB: 41 y.o.   MRN: [id number]   HPI patient presents stating she's developed discoloration of her right big toenail and she would like antifungal and states that she wants Lamisil pulse    ROS      Objective:  Physical Exam neurovascular unchanged with discoloration the right hallux nail affecting the distal one half of the nailbed     Assessment:   Mycotic nail infection right      Plan:    First laser application provided today and we will start pulse Lamisil therapy which was prescribed and if it gives her trouble we'll switch her to Diflucan

## 2016-11-07 ENCOUNTER — Ambulatory Visit: Payer: BLUE CROSS/BLUE SHIELD

## 2017-02-02 ENCOUNTER — Ambulatory Visit (INDEPENDENT_AMBULATORY_CARE_PROVIDER_SITE_OTHER): Payer: BLUE CROSS/BLUE SHIELD | Admitting: Psychology

## 2017-02-02 DIAGNOSIS — F4323 Adjustment disorder with mixed anxiety and depressed mood: Secondary | ICD-10-CM | POA: Diagnosis not present

## 2017-03-02 ENCOUNTER — Ambulatory Visit (INDEPENDENT_AMBULATORY_CARE_PROVIDER_SITE_OTHER): Payer: BLUE CROSS/BLUE SHIELD | Admitting: Psychology

## 2017-03-02 DIAGNOSIS — F4321 Adjustment disorder with depressed mood: Secondary | ICD-10-CM | POA: Diagnosis not present

## 2017-03-08 ENCOUNTER — Ambulatory Visit (INDEPENDENT_AMBULATORY_CARE_PROVIDER_SITE_OTHER): Payer: BLUE CROSS/BLUE SHIELD | Admitting: Psychology

## 2017-03-08 DIAGNOSIS — F4321 Adjustment disorder with depressed mood: Secondary | ICD-10-CM

## 2017-03-14 ENCOUNTER — Ambulatory Visit: Payer: BLUE CROSS/BLUE SHIELD | Admitting: Psychology

## 2017-04-05 ENCOUNTER — Ambulatory Visit (INDEPENDENT_AMBULATORY_CARE_PROVIDER_SITE_OTHER): Payer: BLUE CROSS/BLUE SHIELD | Admitting: Psychology

## 2017-04-05 DIAGNOSIS — F4321 Adjustment disorder with depressed mood: Secondary | ICD-10-CM

## 2017-04-17 ENCOUNTER — Ambulatory Visit: Payer: BLUE CROSS/BLUE SHIELD | Admitting: Psychology

## 2017-04-23 ENCOUNTER — Ambulatory Visit (HOSPITAL_COMMUNITY)
Admission: EM | Admit: 2017-04-23 | Discharge: 2017-04-23 | Disposition: A | Discharge status: Home / Self Care | Payer: Managed Care, Other (non HMO) | Attending: Internal Medicine | Admitting: Internal Medicine

## 2017-04-23 ENCOUNTER — Encounter (HOSPITAL_COMMUNITY): Payer: Self-pay | Admitting: *Deleted

## 2017-04-23 ENCOUNTER — Other Ambulatory Visit: Payer: Self-pay

## 2017-04-23 ENCOUNTER — Ambulatory Visit (INDEPENDENT_AMBULATORY_CARE_PROVIDER_SITE_OTHER): Payer: Managed Care, Other (non HMO)

## 2017-04-23 ENCOUNTER — Telehealth (HOSPITAL_COMMUNITY): Payer: Self-pay | Admitting: *Deleted

## 2017-04-23 DIAGNOSIS — M79604 Pain in right leg: Secondary | ICD-10-CM | POA: Diagnosis not present

## 2017-04-23 DIAGNOSIS — Y9351 Activity, roller skating (inline) and skateboarding: Secondary | ICD-10-CM | POA: Diagnosis not present

## 2017-04-23 DIAGNOSIS — S86911A Strain of unspecified muscle(s) and tendon(s) at lower leg level, right leg, initial encounter: Secondary | ICD-10-CM

## 2017-04-23 MED ORDER — NAPROXEN 500 MG PO TABS: 500.0000 mg | ORAL_TABLET | Freq: Two times a day (BID) | ORAL | 0 refills | Status: DC

## 2017-04-23 MED ORDER — NAPROXEN 500 MG PO TABS: 500.0000 mg | ORAL_TABLET | Freq: Two times a day (BID) | ORAL | 0 refills | Status: AC

## 2017-04-23 MED ORDER — METHOCARBAMOL 500 MG PO TABS: 500.0000 mg | ORAL_TABLET | Freq: Two times a day (BID) | ORAL | 0 refills | Status: DC

## 2017-04-23 NOTE — Telephone Encounter (Addendum)
Reports Rxs not given, and pharmacy has no e-prescribed Rxs in system from today.  [facility]

## 2017-04-23 NOTE — ED Provider Notes (Signed)
[facility]    CSN: [id number] Arrival date & time: 04/23/17  1644     History   Chief Complaint Chief Complaint  Patient presents with  . Leg Injury    HPI Mackenzie PiqueLauren C Khan is a 41 y.o. female.   41 year old female, with history of depression, GERD, presenting today complaining of right lower leg pain.  States that she was rollerskating yesterday with her daughter and fell.  States that so she felt a "buckle" of her proximal right shin.  Since that time, she has had pain with ambulation.  She has been ambulating with a cane that she states helps her.  She denies any injury to the knee or ankle.   The history is provided by the patient.  Leg Pain  Location:  Leg Time since incident:  1 day Injury: yes   Mechanism of injury: fall   Fall:    Fall occurred:  Recreating/playing   Impact surface:  Concrete   Entrapped after fall: no   Leg location:  R lower leg Pain details:    Quality:  Aching   Radiates to:  Does not radiate   Severity:  Moderate   Onset quality:  Gradual   Duration:  1 day   Timing:  Constant   Progression:  Unchanged Chronicity:  New Dislocation: no   Foreign body present:  No foreign bodies Tetanus status:  Unknown Prior injury to area:  No Relieved by:  Nothing Worsened by:  Bearing weight and activity Ineffective treatments:  None tried Associated symptoms: stiffness   Associated symptoms: no back pain, no decreased ROM, no fatigue, no fever, no itching, no muscle weakness, no neck pain, no numbness, no swelling and no tingling   Risk factors: no concern for non-accidental trauma, no frequent fractures and no known bone disorder     Past Medical History:  Diagnosis Date  . Depression   . GERD (gastroesophageal reflux disease)    pregnancy related-pepcid/prilosec    There are no active problems to display for this patient.   Past Surgical History:  Procedure Laterality Date  . CESAREAN SECTION    . CESAREAN SECTION   08/03/2011   Procedure: CESAREAN SECTION;  Surgeon: Loney LaurenceMichelle A Horvath, MD;  Location: [facility];  Service: Gynecology;  Laterality: N/A;  Repeat Cesarean Section Delivery  Boy  @ 0157,    . tab  2007  . WRIST FRACTURE SURGERY      OB History    Gravida Para Term Preterm AB Living   5 2 2  0 3 3   SAB TAB Ectopic Multiple Live Births   2 1   1 2        Home Medications    Prior to Admission medications   Medication Sig Start Date End Date Taking? Authorizing Provider  drospirenone-ethinyl estradiol (NIKKI) 3-0.02 MG tablet Take 1 tablet by mouth daily.   Yes [provider]  fluconazole (DIFLUCAN) 150 MG tablet Take 1 tablet (150 mg total) by mouth once a week. For 4 weeks 06/29/16   Lenn SinkRegal, Norman S, DPM  GIANVI 3-0.02 MG tablet  06/24/16   [provider]  methocarbamol (ROBAXIN) 500 MG tablet Take 1 tablet (500 mg total) by mouth 2 (two) times daily. 04/23/17   Ziv Welchel C, PA-C  multivitamin-iron-minerals-folic acid (CENTRUM) chewable tablet Chew 1 tablet by mouth daily.    [provider]  naproxen (NAPROSYN) 500 MG tablet Take 1 tablet (500 mg total) by mouth 2 (two) times  daily. 04/23/17   Mont Jagoda C, PA-C  terbinafine (LAMISIL) 250 MG tablet Please take one a day x 7days, repeat every 4 weeks x 4 months 10/07/16   Lenn Sinkegal, Norman S, DPM  valACYclovir (VALTREX) 1000 MG tablet Take 1,000 mg by mouth as needed.  05/15/15   [provider]    Family History No family history on file.  Social History Social History   Tobacco Use  . Smoking status: Former Games developermoker  . Smokeless tobacco: Never Used  Substance Use Topics  . Alcohol use: No  . Drug use: No     Allergies   Latex and Sulfa antibiotics   Review of Systems Review of Systems  Constitutional: Negative for chills, fatigue and fever.  HENT: Negative for ear pain and sore throat.   Eyes: Negative for pain and visual disturbance.  Respiratory: Negative for cough and shortness of  breath.   Cardiovascular: Negative for chest pain and palpitations.  Gastrointestinal: Negative for abdominal pain and vomiting.  Genitourinary: Negative for dysuria and hematuria.  Musculoskeletal: Positive for arthralgias and stiffness. Negative for back pain and neck pain.  Skin: Negative for color change, itching and rash.  Neurological: Negative for seizures and syncope.  All other systems reviewed and are negative.    Physical Exam Triage Vital Signs ED Triage Vitals  Enc Vitals Group     BP 04/23/17 1722 109/68     Pulse Rate 04/23/17 1722 82     Resp 04/23/17 1722 16     Temp 04/23/17 1722 97.9 F (36.6 C)     Temp Source 04/23/17 1722 Oral     SpO2 04/23/17 1722 100 %     Weight --      Height --      Head Circumference --      Peak Flow --      Pain Score 04/23/17 1723 6     Pain Loc --      Pain Edu? --      Excl. in GC? --    No data found.  Updated Vital Signs BP 109/68   Pulse 82   Temp 97.9 F (36.6 C) (Oral)   Resp 16   SpO2 100%   Breastfeeding? No   Visual Acuity Right Eye Distance:   Left Eye Distance:   Bilateral Distance:    Right Eye Near:   Left Eye Near:    Bilateral Near:     Physical Exam  Constitutional: She appears well-developed and well-nourished. No distress.  HENT:  Head: Normocephalic and atraumatic.  Eyes: Conjunctivae are normal.  Neck: Neck supple.  Cardiovascular: Normal rate and regular rhythm.  No murmur heard. Pulmonary/Chest: Effort normal and breath sounds normal. No respiratory distress.  Abdominal: Soft. There is no tenderness.  Musculoskeletal: She exhibits no edema.       Right lower leg: She exhibits tenderness.       Legs: Neurological: She is alert.  Skin: Skin is warm and dry.  Psychiatric: She has a normal mood and affect.  Nursing note and vitals reviewed.    UC Treatments / Results  Labs (all labs ordered are listed, but only abnormal results are displayed) Labs Reviewed - No data to  display  EKG  EKG Interpretation None       Radiology Dg Tibia/fibula Right  Result Date: 04/23/2017 CLINICAL DATA:  Right leg pain after roller-skating injury. EXAM: RIGHT TIBIA AND FIBULA - 2 VIEW COMPARISON:  Same day ankle radiographs. FINDINGS: There  is no evidence of fracture or other focal bone lesions. The tip of the fibula on the AP view is included as part of the same day radiographs of the ankle. Soft tissues are unremarkable. IMPRESSION: No acute or focal abnormality. Electronically Signed   By: Tollie Eth M.D.   On: 04/23/2017 17:43    Procedures Procedures (including critical care time)  Medications Ordered in UC Medications - No data to display   Initial Impression / Assessment and Plan / UC Course  I have reviewed the triage vital signs and the nursing notes.  Pertinent labs & imaging results that were available during my care of the patient were reviewed by me and considered in my medical decision making (see chart for details).     Normal XR - likely MSK strain  Final Clinical Impressions(s) / UC Diagnoses   Final diagnoses:  Muscle strain of right lower leg, initial encounter    ED Discharge Orders        Ordered    methocarbamol (ROBAXIN) 500 MG tablet  2 times daily     04/23/17 1749    naproxen (NAPROSYN) 500 MG tablet  2 times daily     04/23/17 1749       Controlled Substance Prescriptions [place] Controlled Substance Registry consulted? Not Applicable   Alecia Lemming, [place] 04/23/17 1830

## 2017-04-23 NOTE — ED Triage Notes (Signed)
Reports rollerskating yesterday when she fell, injuring right lower leg.  Denies any ankle, foot, or knee pain.  CMS intact.  Has been limping with a cane.

## 2017-05-15 ENCOUNTER — Ambulatory Visit (INDEPENDENT_AMBULATORY_CARE_PROVIDER_SITE_OTHER): Payer: BLUE CROSS/BLUE SHIELD | Admitting: Psychology

## 2017-05-15 DIAGNOSIS — F4321 Adjustment disorder with depressed mood: Secondary | ICD-10-CM

## 2017-06-09 ENCOUNTER — Ambulatory Visit (INDEPENDENT_AMBULATORY_CARE_PROVIDER_SITE_OTHER): Payer: BLUE CROSS/BLUE SHIELD | Admitting: Psychology

## 2017-06-09 DIAGNOSIS — F4321 Adjustment disorder with depressed mood: Secondary | ICD-10-CM | POA: Diagnosis not present

## 2017-06-23 ENCOUNTER — Ambulatory Visit (INDEPENDENT_AMBULATORY_CARE_PROVIDER_SITE_OTHER): Payer: BLUE CROSS/BLUE SHIELD | Admitting: Psychology

## 2017-06-23 DIAGNOSIS — F4321 Adjustment disorder with depressed mood: Secondary | ICD-10-CM

## 2017-07-19 ENCOUNTER — Ambulatory Visit: Payer: BLUE CROSS/BLUE SHIELD | Admitting: Psychology

## 2017-07-31 ENCOUNTER — Ambulatory Visit (INDEPENDENT_AMBULATORY_CARE_PROVIDER_SITE_OTHER): Payer: BLUE CROSS/BLUE SHIELD | Admitting: Psychology

## 2017-07-31 DIAGNOSIS — F4321 Adjustment disorder with depressed mood: Secondary | ICD-10-CM | POA: Diagnosis not present

## 2017-08-21 ENCOUNTER — Ambulatory Visit (INDEPENDENT_AMBULATORY_CARE_PROVIDER_SITE_OTHER): Payer: BLUE CROSS/BLUE SHIELD | Admitting: Psychology

## 2017-08-21 DIAGNOSIS — F4321 Adjustment disorder with depressed mood: Secondary | ICD-10-CM | POA: Diagnosis not present

## 2017-09-13 ENCOUNTER — Other Ambulatory Visit: Payer: Self-pay

## 2017-09-13 DIAGNOSIS — N760 Acute vaginitis: Secondary | ICD-10-CM | POA: Insufficient documentation

## 2017-09-14 ENCOUNTER — Ambulatory Visit (INDEPENDENT_AMBULATORY_CARE_PROVIDER_SITE_OTHER): Payer: BLUE CROSS/BLUE SHIELD | Admitting: Podiatry

## 2017-09-14 ENCOUNTER — Encounter: Payer: Self-pay | Admitting: Podiatry

## 2017-09-14 DIAGNOSIS — B351 Tinea unguium: Secondary | ICD-10-CM

## 2017-09-14 MED ORDER — TERBINAFINE HCL 250 MG PO TABS: ORAL_TABLET | ORAL | 0 refills | Status: AC

## 2017-09-14 NOTE — Progress Notes (Signed)
Subjective:   Patient ID: Mackenzie Khan, female   DOB: 42 y.o.   MRN: [id number]   HPI Patient presents stating in the right big toenail fungus is started to come back and she wants to initiate laser   ROS      Objective:  Physical Exam  Neurovascular status intact with a small streak on the medial side of the right hallux that is present down to the base with complete eradication of symptoms for a period of time     Assessment:  Recurrent mycotic nail infection     Plan:  Initiate 3 laser treatments and will start pulse Lamisil therapy at this time.  Reappoint to reevaluate and pictures were taken today

## 2017-09-20 ENCOUNTER — Ambulatory Visit (INDEPENDENT_AMBULATORY_CARE_PROVIDER_SITE_OTHER): Payer: BLUE CROSS/BLUE SHIELD | Admitting: Psychology

## 2017-09-20 DIAGNOSIS — F4321 Adjustment disorder with depressed mood: Secondary | ICD-10-CM | POA: Diagnosis not present

## 2017-09-26 ENCOUNTER — Ambulatory Visit (INDEPENDENT_AMBULATORY_CARE_PROVIDER_SITE_OTHER): Payer: Self-pay

## 2017-09-26 DIAGNOSIS — B351 Tinea unguium: Secondary | ICD-10-CM

## 2017-10-10 NOTE — Progress Notes (Signed)
Pt presents with mycotic infection of nails  All other systems are negative  Laser therapy administered to affected nails and tolerated well. All safety precautions were in place. Re-appointed in 4 weeks for 2nd treatment 

## 2017-10-11 ENCOUNTER — Ambulatory Visit (INDEPENDENT_AMBULATORY_CARE_PROVIDER_SITE_OTHER): Payer: BLUE CROSS/BLUE SHIELD | Admitting: Psychology

## 2017-10-11 DIAGNOSIS — F4321 Adjustment disorder with depressed mood: Secondary | ICD-10-CM | POA: Diagnosis not present

## 2017-10-19 ENCOUNTER — Ambulatory Visit: Payer: BLUE CROSS/BLUE SHIELD | Admitting: Psychology

## 2017-10-24 ENCOUNTER — Ambulatory Visit: Payer: Self-pay

## 2017-10-24 DIAGNOSIS — B351 Tinea unguium: Secondary | ICD-10-CM

## 2017-10-27 NOTE — Progress Notes (Signed)
Pt presents with mycotic infection of nails  All other systems are negative  Laser therapy administered to affected nails and tolerated well. All safety precautions were in place. Re-appointed in 4 weeks for 3rd treatment 

## 2017-11-20 ENCOUNTER — Telehealth: Payer: Self-pay | Admitting: Podiatry

## 2017-11-20 NOTE — Telephone Encounter (Signed)
Patient needs a refill on Lamisil only received 1 months worth. "Please call patient back at [phone]

## 2017-11-21 ENCOUNTER — Other Ambulatory Visit: Payer: BLUE CROSS/BLUE SHIELD

## 2017-11-23 ENCOUNTER — Ambulatory Visit (INDEPENDENT_AMBULATORY_CARE_PROVIDER_SITE_OTHER): Payer: BLUE CROSS/BLUE SHIELD | Admitting: Psychology

## 2017-11-23 DIAGNOSIS — F4321 Adjustment disorder with depressed mood: Secondary | ICD-10-CM | POA: Diagnosis not present

## 2017-11-29 NOTE — Telephone Encounter (Signed)
I informed pt of Dr. Regal's orders. 

## 2017-11-29 NOTE — Telephone Encounter (Signed)
Should have 4 months worth. 7 pills a month for 4 months

## 2017-12-18 ENCOUNTER — Other Ambulatory Visit: Payer: BLUE CROSS/BLUE SHIELD

## 2017-12-20 ENCOUNTER — Ambulatory Visit (INDEPENDENT_AMBULATORY_CARE_PROVIDER_SITE_OTHER): Payer: BLUE CROSS/BLUE SHIELD | Admitting: Psychology

## 2017-12-20 DIAGNOSIS — F4321 Adjustment disorder with depressed mood: Secondary | ICD-10-CM

## 2018-01-23 ENCOUNTER — Ambulatory Visit: Payer: BLUE CROSS/BLUE SHIELD

## 2018-01-23 DIAGNOSIS — B351 Tinea unguium: Secondary | ICD-10-CM

## 2018-01-26 NOTE — Progress Notes (Signed)
Pt presents with mycotic infection of nails  All other systems are negative  Laser therapy administered to affected nails and tolerated well. All safety precautions were in place. Re-appointed in 4 weeks for 4th treatment 

## 2018-02-01 ENCOUNTER — Ambulatory Visit (INDEPENDENT_AMBULATORY_CARE_PROVIDER_SITE_OTHER): Payer: BLUE CROSS/BLUE SHIELD | Admitting: Psychology

## 2018-02-01 DIAGNOSIS — F4321 Adjustment disorder with depressed mood: Secondary | ICD-10-CM

## 2018-02-12 ENCOUNTER — Ambulatory Visit (INDEPENDENT_AMBULATORY_CARE_PROVIDER_SITE_OTHER): Payer: BLUE CROSS/BLUE SHIELD | Admitting: Psychology

## 2018-02-12 DIAGNOSIS — F4321 Adjustment disorder with depressed mood: Secondary | ICD-10-CM

## 2018-02-26 ENCOUNTER — Ambulatory Visit: Payer: Self-pay

## 2018-02-26 DIAGNOSIS — B351 Tinea unguium: Secondary | ICD-10-CM

## 2018-03-02 NOTE — Progress Notes (Signed)
Pt presents with mycotic infection of nails  All other systems are negative  Laser therapy administered to affected nails and tolerated well. All safety precautions were in place.Re-appointed in 4 weeks for 5th treatment 

## 2018-03-23 ENCOUNTER — Ambulatory Visit (INDEPENDENT_AMBULATORY_CARE_PROVIDER_SITE_OTHER): Payer: BLUE CROSS/BLUE SHIELD | Admitting: Psychology

## 2018-03-23 DIAGNOSIS — F4321 Adjustment disorder with depressed mood: Secondary | ICD-10-CM

## 2018-03-26 ENCOUNTER — Ambulatory Visit: Payer: Self-pay

## 2018-03-26 DIAGNOSIS — B351 Tinea unguium: Secondary | ICD-10-CM

## 2018-03-27 NOTE — Progress Notes (Signed)
Pt presents with mycotic infection of nails  All other systems are negative  Laser therapy administered to affected nails and tolerated well. All safety precautions were in place.  Reappointed as needed

## 2018-04-12 ENCOUNTER — Ambulatory Visit (INDEPENDENT_AMBULATORY_CARE_PROVIDER_SITE_OTHER): Payer: BLUE CROSS/BLUE SHIELD | Admitting: Psychology

## 2018-04-12 DIAGNOSIS — F4321 Adjustment disorder with depressed mood: Secondary | ICD-10-CM

## 2018-04-25 ENCOUNTER — Ambulatory Visit (INDEPENDENT_AMBULATORY_CARE_PROVIDER_SITE_OTHER): Payer: BLUE CROSS/BLUE SHIELD | Admitting: Psychology

## 2018-04-25 DIAGNOSIS — F4321 Adjustment disorder with depressed mood: Secondary | ICD-10-CM

## 2018-05-18 ENCOUNTER — Ambulatory Visit (INDEPENDENT_AMBULATORY_CARE_PROVIDER_SITE_OTHER): Payer: BLUE CROSS/BLUE SHIELD | Admitting: Psychology

## 2018-05-18 DIAGNOSIS — F4321 Adjustment disorder with depressed mood: Secondary | ICD-10-CM

## 2018-05-31 ENCOUNTER — Ambulatory Visit (INDEPENDENT_AMBULATORY_CARE_PROVIDER_SITE_OTHER): Payer: BLUE CROSS/BLUE SHIELD | Admitting: Psychology

## 2018-05-31 DIAGNOSIS — F4321 Adjustment disorder with depressed mood: Secondary | ICD-10-CM | POA: Diagnosis not present

## 2018-06-12 ENCOUNTER — Ambulatory Visit: Payer: BLUE CROSS/BLUE SHIELD | Admitting: Psychology

## 2018-06-29 ENCOUNTER — Ambulatory Visit: Payer: BLUE CROSS/BLUE SHIELD | Admitting: Psychology

## 2018-07-18 ENCOUNTER — Ambulatory Visit (INDEPENDENT_AMBULATORY_CARE_PROVIDER_SITE_OTHER): Payer: BLUE CROSS/BLUE SHIELD | Admitting: Psychology

## 2018-07-18 DIAGNOSIS — F4321 Adjustment disorder with depressed mood: Secondary | ICD-10-CM

## 2018-08-02 ENCOUNTER — Ambulatory Visit: Payer: BLUE CROSS/BLUE SHIELD | Admitting: Psychology

## 2018-08-29 ENCOUNTER — Ambulatory Visit: Payer: BLUE CROSS/BLUE SHIELD | Admitting: Psychology

## 2020-06-03 ENCOUNTER — Ambulatory Visit (INDEPENDENT_AMBULATORY_CARE_PROVIDER_SITE_OTHER): Payer: BC Managed Care – PPO | Admitting: Psychology

## 2020-06-03 DIAGNOSIS — F4323 Adjustment disorder with mixed anxiety and depressed mood: Secondary | ICD-10-CM

## 2020-06-05 ENCOUNTER — Other Ambulatory Visit: Payer: Self-pay | Admitting: Obstetrics and Gynecology

## 2020-06-05 DIAGNOSIS — R928 Other abnormal and inconclusive findings on diagnostic imaging of breast: Secondary | ICD-10-CM

## 2020-06-15 ENCOUNTER — Ambulatory Visit (INDEPENDENT_AMBULATORY_CARE_PROVIDER_SITE_OTHER): Payer: BC Managed Care – PPO | Admitting: Psychology

## 2020-06-15 DIAGNOSIS — F4323 Adjustment disorder with mixed anxiety and depressed mood: Secondary | ICD-10-CM | POA: Diagnosis not present

## 2020-06-18 ENCOUNTER — Other Ambulatory Visit: Payer: Self-pay

## 2020-06-19 ENCOUNTER — Other Ambulatory Visit: Payer: Self-pay

## 2020-06-19 ENCOUNTER — Ambulatory Visit: Payer: Self-pay

## 2020-06-19 ENCOUNTER — Ambulatory Visit
Admission: RE | Admit: 2020-06-19 | Discharge: 2020-06-19 | Disposition: A | Discharge status: Home / Self Care | Payer: BC Managed Care – PPO | Source: Ambulatory Visit | Attending: Obstetrics and Gynecology | Admitting: Obstetrics and Gynecology

## 2020-06-19 DIAGNOSIS — R928 Other abnormal and inconclusive findings on diagnostic imaging of breast: Secondary | ICD-10-CM

## 2020-07-02 ENCOUNTER — Ambulatory Visit (INDEPENDENT_AMBULATORY_CARE_PROVIDER_SITE_OTHER): Payer: BC Managed Care – PPO | Admitting: Psychology

## 2020-07-02 DIAGNOSIS — F4323 Adjustment disorder with mixed anxiety and depressed mood: Secondary | ICD-10-CM | POA: Diagnosis not present

## 2020-07-15 ENCOUNTER — Ambulatory Visit (INDEPENDENT_AMBULATORY_CARE_PROVIDER_SITE_OTHER): Payer: BC Managed Care – PPO | Admitting: Psychology

## 2020-07-15 DIAGNOSIS — F4323 Adjustment disorder with mixed anxiety and depressed mood: Secondary | ICD-10-CM | POA: Diagnosis not present

## 2020-08-14 ENCOUNTER — Ambulatory Visit (INDEPENDENT_AMBULATORY_CARE_PROVIDER_SITE_OTHER): Payer: BC Managed Care – PPO | Admitting: Podiatry

## 2020-08-14 ENCOUNTER — Other Ambulatory Visit: Payer: Self-pay

## 2020-08-14 ENCOUNTER — Encounter: Payer: Self-pay | Admitting: Podiatry

## 2020-08-14 DIAGNOSIS — M722 Plantar fascial fibromatosis: Secondary | ICD-10-CM | POA: Diagnosis not present

## 2020-08-14 DIAGNOSIS — B351 Tinea unguium: Secondary | ICD-10-CM | POA: Diagnosis not present

## 2020-08-14 MED ORDER — TERBINAFINE HCL 250 MG PO TABS: ORAL_TABLET | ORAL | 0 refills | Status: AC

## 2020-08-14 NOTE — Progress Notes (Signed)
Subjective:   Patient ID: Mackenzie Khan, female   DOB: 45 y.o.   MRN: [id number]   HPI Patient states that she has a nail third left that has developed fungus again and she like it treated with laser and medication as it did well with the other nails and she has pain in her feet with chronic pain in her heels that orthotics have helped with her 45 years old at this time   ROS      Objective:  Physical Exam  Neurovascular status intact with patient found to have mild discomfort plantar fascial band bilateral that comes and goes along with orthotics that are 47 years old and of lost her top covers completely with moderate loss of arch height.  Also has discoloration left third nail     Assessment:  Moderate plantar fasciitis bilateral along with nail disease third left foot and structural changes in both feet     Plan:  H&P x-rays reviewed and I recommended orthotics for the long-term and went ahead and casted for functional orthotic devices.  I then discussed the nails I recommended laser therapy and patient is scheduled to have laser therapy done and I educated her on this and also placed on a pulse laser Lamisil treatment that I think can help also with the discoloration of the nailbed

## 2020-08-18 ENCOUNTER — Ambulatory Visit: Payer: BC Managed Care – PPO | Admitting: Psychology

## 2020-08-20 ENCOUNTER — Other Ambulatory Visit: Payer: Self-pay

## 2020-08-20 ENCOUNTER — Ambulatory Visit (INDEPENDENT_AMBULATORY_CARE_PROVIDER_SITE_OTHER): Payer: BC Managed Care – PPO

## 2020-08-20 DIAGNOSIS — B351 Tinea unguium: Secondary | ICD-10-CM

## 2020-08-20 NOTE — Patient Instructions (Signed)

## 2020-08-20 NOTE — Progress Notes (Signed)
Patient presents today for the 1st laser treatment. Diagnosed with mycotic nail infection by Dr. Charlsie Merles.   Toenail most affected 3rd left.  All other systems are negative.  Nails were filed thin. Laser therapy was administered to 3rd left toenails and patient tolerated the treatment well. All safety precautions were in place.   Patient was prescribed Lamisil and will begin taking it this week.   Follow up in 4 weeks for laser # 2.

## 2020-09-02 ENCOUNTER — Ambulatory Visit (INDEPENDENT_AMBULATORY_CARE_PROVIDER_SITE_OTHER): Payer: BC Managed Care – PPO | Admitting: Psychology

## 2020-09-02 DIAGNOSIS — F4323 Adjustment disorder with mixed anxiety and depressed mood: Secondary | ICD-10-CM | POA: Diagnosis not present

## 2020-09-10 ENCOUNTER — Ambulatory Visit (INDEPENDENT_AMBULATORY_CARE_PROVIDER_SITE_OTHER): Payer: BC Managed Care – PPO | Admitting: Psychology

## 2020-09-10 DIAGNOSIS — F4323 Adjustment disorder with mixed anxiety and depressed mood: Secondary | ICD-10-CM

## 2020-09-17 ENCOUNTER — Ambulatory Visit: Payer: BC Managed Care – PPO | Admitting: Psychology

## 2020-09-18 ENCOUNTER — Other Ambulatory Visit: Payer: Self-pay

## 2020-09-18 ENCOUNTER — Ambulatory Visit (INDEPENDENT_AMBULATORY_CARE_PROVIDER_SITE_OTHER): Payer: BC Managed Care – PPO | Admitting: *Deleted

## 2020-09-18 DIAGNOSIS — M722 Plantar fascial fibromatosis: Secondary | ICD-10-CM

## 2020-09-18 DIAGNOSIS — B351 Tinea unguium: Secondary | ICD-10-CM

## 2020-09-18 NOTE — Progress Notes (Signed)
Patient presents today to pick up custom molded foot orthotics, diagnosed with plantar fasciitisw by Dr. Charlsie Merles.   Orthotics were dispensed and fit was satisfactory. Reviewed instructions for break-in and wear. Written instructions given to patient.  Patient will follow up as needed.   [facility] - order # [id number]   Patient also wanted her old orthotics refurbished and gave those to [facility] to send in. She will contact patient once they arrive back in office.

## 2020-09-18 NOTE — Progress Notes (Signed)
Patient presents today for the 2nd laser treatment. Diagnosed with mycotic nail infection by Dr. Charlsie Merles.   Toenail most affected 3rd left.   All other systems are negative.  Nails were filed thin. Laser therapy was administered to 3rd left toenails and patient tolerated the treatment well. All safety precautions were in place.   Patient was prescribed Lamisil and will begin taking it this week. She does admit she hasn't been taking them as instructed.  Follow up in 4 weeks for laser # 3.

## 2020-09-23 ENCOUNTER — Ambulatory Visit (INDEPENDENT_AMBULATORY_CARE_PROVIDER_SITE_OTHER): Payer: BC Managed Care – PPO | Admitting: Psychology

## 2020-09-23 DIAGNOSIS — F4323 Adjustment disorder with mixed anxiety and depressed mood: Secondary | ICD-10-CM

## 2020-09-30 ENCOUNTER — Ambulatory Visit (INDEPENDENT_AMBULATORY_CARE_PROVIDER_SITE_OTHER): Payer: BC Managed Care – PPO | Admitting: Psychology

## 2020-09-30 DIAGNOSIS — F4323 Adjustment disorder with mixed anxiety and depressed mood: Secondary | ICD-10-CM

## 2020-10-12 ENCOUNTER — Telehealth: Payer: Self-pay | Admitting: Podiatry

## 2020-10-12 NOTE — Telephone Encounter (Signed)
Refurbished orthotics in... pt aware ok to pick up. 90 balance.

## 2020-10-30 ENCOUNTER — Ambulatory Visit (INDEPENDENT_AMBULATORY_CARE_PROVIDER_SITE_OTHER): Payer: Self-pay

## 2020-10-30 ENCOUNTER — Other Ambulatory Visit: Payer: Self-pay

## 2020-10-30 DIAGNOSIS — B351 Tinea unguium: Secondary | ICD-10-CM

## 2020-10-30 NOTE — Progress Notes (Signed)
Patient presents today for the 3rd laser treatment. Diagnosed with mycotic nail infection by Dr. Charlsie Merles.   Toenail most affected 3rd left.   All other systems are negative.  Nails were filed thin. Laser therapy was administered to 3rd left toenails and patient tolerated the treatment well. All safety precautions were in place.   Patient was prescribed Lamisil and will begin taking it this week. She does admit she hasn't been taking them as instructed.  Follow up in 6 weeks for laser # 4.

## 2020-12-25 ENCOUNTER — Other Ambulatory Visit: Payer: BC Managed Care – PPO

## 2021-01-04 ENCOUNTER — Ambulatory Visit (INDEPENDENT_AMBULATORY_CARE_PROVIDER_SITE_OTHER): Payer: BC Managed Care – PPO

## 2021-01-04 ENCOUNTER — Other Ambulatory Visit: Payer: Self-pay

## 2021-01-04 DIAGNOSIS — B351 Tinea unguium: Secondary | ICD-10-CM

## 2021-01-04 NOTE — Patient Instructions (Signed)

## 2021-01-04 NOTE — Progress Notes (Signed)
Patient presents today for the 4th laser treatment. Diagnosed with mycotic nail infection by Dr. Charlsie Merles.   Toenail most affected 3rd left.   All other systems are negative.  Nails were filed thin. Laser therapy was administered to 3rd left toenails and patient tolerated the treatment well. All safety precautions were in place.   Patient was prescribed Lamisil and will begin taking it this week. She does admit she hasn't been taking them as instructed.  Patient has completed the recommended laser treatments. He will follow up with Dr. Charlsie Merles in 3 months to evaluate progress.

## 2021-03-09 ENCOUNTER — Ambulatory Visit (INDEPENDENT_AMBULATORY_CARE_PROVIDER_SITE_OTHER): Payer: BC Managed Care – PPO | Admitting: Psychology

## 2021-03-09 DIAGNOSIS — F4321 Adjustment disorder with depressed mood: Secondary | ICD-10-CM | POA: Diagnosis not present

## 2021-03-23 ENCOUNTER — Ambulatory Visit (INDEPENDENT_AMBULATORY_CARE_PROVIDER_SITE_OTHER): Payer: BC Managed Care – PPO | Admitting: Psychology

## 2021-03-23 DIAGNOSIS — F4321 Adjustment disorder with depressed mood: Secondary | ICD-10-CM

## 2021-04-07 ENCOUNTER — Ambulatory Visit (INDEPENDENT_AMBULATORY_CARE_PROVIDER_SITE_OTHER): Payer: BC Managed Care – PPO | Admitting: Psychology

## 2021-04-07 ENCOUNTER — Ambulatory Visit: Payer: BC Managed Care – PPO | Admitting: Podiatry

## 2021-04-07 DIAGNOSIS — F4321 Adjustment disorder with depressed mood: Secondary | ICD-10-CM

## 2021-04-12 ENCOUNTER — Ambulatory Visit (INDEPENDENT_AMBULATORY_CARE_PROVIDER_SITE_OTHER): Payer: BC Managed Care – PPO | Admitting: Podiatry

## 2021-04-12 ENCOUNTER — Encounter: Payer: Self-pay | Admitting: Podiatry

## 2021-04-12 ENCOUNTER — Other Ambulatory Visit: Payer: Self-pay

## 2021-04-12 DIAGNOSIS — M722 Plantar fascial fibromatosis: Secondary | ICD-10-CM

## 2021-04-12 DIAGNOSIS — B351 Tinea unguium: Secondary | ICD-10-CM

## 2021-04-12 NOTE — Progress Notes (Signed)
Subjective:   Patient ID: Mackenzie Khan, female   DOB: 45 y.o.   MRN: [id number]   HPI Patient states that she is improved but she is also developed a rash on the top of her right foot recently and does have allergies to latex   ROS      Objective:  Physical Exam  Neurovascular status intact with nailbeds that have improved with reduced discoloration with some rash formation dorsal right foot localized to the metatarsal region with no drainage or other pathology     Assessment:  Improvement in nail beds with pulse Lamisil and laser therapy along with probability for a hypersensitivity to exposure Plan:  H&P reviewed both conditions recommended steroid cream for dorsal right possible Benadryl I do not recommend treatment even though in the future probably will require another pulse therapy and possibly laser which I educated her on today

## 2021-04-15 ENCOUNTER — Other Ambulatory Visit: Payer: Self-pay | Admitting: Obstetrics and Gynecology

## 2021-04-15 DIAGNOSIS — Z1231 Encounter for screening mammogram for malignant neoplasm of breast: Secondary | ICD-10-CM

## 2021-04-21 ENCOUNTER — Ambulatory Visit (INDEPENDENT_AMBULATORY_CARE_PROVIDER_SITE_OTHER): Payer: BC Managed Care – PPO | Admitting: Psychology

## 2021-04-21 DIAGNOSIS — F4321 Adjustment disorder with depressed mood: Secondary | ICD-10-CM

## 2021-05-17 ENCOUNTER — Ambulatory Visit (INDEPENDENT_AMBULATORY_CARE_PROVIDER_SITE_OTHER): Payer: BC Managed Care – PPO | Admitting: Psychology

## 2021-05-17 DIAGNOSIS — F4321 Adjustment disorder with depressed mood: Secondary | ICD-10-CM

## 2021-05-17 NOTE — Progress Notes (Signed)
[facility] Counselor/Therapist Progress Note  Patient ID: Mackenzie Khan, MRN: [id number],    Date: 05/17/2021  Time Spent: 11:00am-12:00pm   60 minutes   Treatment Type: Individual Therapy  Reported Symptoms: sadness, stress  Mental Status Exam: Appearance:  Casual     Behavior: Appropriate  Motor: Normal  Speech/Language:  Normal Rate  Affect: Appropriate and Tearful  Mood: normal  Thought process: normal  Thought content:   WNL  Sensory/Perceptual disturbances:   WNL  Orientation: oriented to person, place, time/date, and situation  Attention: Good  Concentration: Good  Memory: WNL  Fund of knowledge:  Good  Insight:   Good  Judgment:  Good  Impulse Control: Good   Risk Assessment: Danger to Self:  No Self-injurious Behavior: No Danger to Others: No Duty to Warn:no Physical Aggression / Violence:No  Access to Firearms a concern: No  Gang Involvement:No   Subjective: Pt present for face-to-face individual therapy via video Webex.  Pt consents to telehealth video therapy due to COVID 19 pandemic. Location of pt: home. Location of therapist: home office.  Pt talked about her daughter Mackenzie Khan who refused to go to school today.  Pt states she feels like a 48 yo is "ruling their house" right now.  She won't listen to pt or husband and is being very difficult to deal with.   Pt states Christmas break was stressful bc of the kids.  Mackenzie Khan was "snarky and full of attitude".   Addressed pt's issues with Mackenzie Khan.  Mackenzie Khan has been sleeping with pt since 2019.  Pt is not ready to work on that bc bedtime is one of the only times of connection with Mackenzie Khan.  Worked on parenting strategies.   Pt does not have much support from her husband bc pt has historically handled things with the kids.  Since the problems have increased she has elicited his help but it is like he is new at this and is not always the most helpful.   Pt has realized that the past 5 years she has  just been "trying to survive" with managing issues with her mother who has alzheimers and parenting her 3 kids.  Pt feels envious about how well her brother is doing and how much his family is thriving.  Pt states she has always been the responsible one and she is tired of it.   Worked with pt on healthy coping strategies.  Worked with pt on self care strategies.    Provided supportive therapy.     Interventions: Cognitive Behavioral Therapy and Insight-Oriented  Diagnosis: F43.21  Plan: See pt's Treatment Plan for depression in Therapy Charts.  (Treatment Plan Target Date: 03/09/2022) Pt is progressing toward treatment goals.   Plan to continue to see pt every two weeks.    Brallan Denio, LCSW

## 2021-06-02 ENCOUNTER — Ambulatory Visit: Payer: BC Managed Care – PPO | Admitting: Psychology

## 2021-06-18 ENCOUNTER — Ambulatory Visit: Payer: BC Managed Care – PPO | Admitting: Psychology

## 2021-06-21 ENCOUNTER — Other Ambulatory Visit: Payer: Self-pay | Admitting: Obstetrics and Gynecology

## 2021-06-21 ENCOUNTER — Ambulatory Visit
Admission: RE | Admit: 2021-06-21 | Discharge: 2021-06-21 | Disposition: A | Discharge status: Home / Self Care | Payer: BC Managed Care – PPO | Source: Ambulatory Visit | Attending: Obstetrics and Gynecology | Admitting: Obstetrics and Gynecology

## 2021-06-21 DIAGNOSIS — Z1231 Encounter for screening mammogram for malignant neoplasm of breast: Secondary | ICD-10-CM

## 2021-06-21 DIAGNOSIS — R928 Other abnormal and inconclusive findings on diagnostic imaging of breast: Secondary | ICD-10-CM

## 2021-07-02 ENCOUNTER — Ambulatory Visit (INDEPENDENT_AMBULATORY_CARE_PROVIDER_SITE_OTHER): Payer: BC Managed Care – PPO | Admitting: Psychology

## 2021-07-02 DIAGNOSIS — F4321 Adjustment disorder with depressed mood: Secondary | ICD-10-CM | POA: Diagnosis not present

## 2021-07-02 NOTE — Progress Notes (Signed)
[facility] Counselor/Therapist Progress Note  Patient ID: Mackenzie Khan, MRN: [id number],    Date: 07/02/2021  Time Spent: 11:00am-12:00pm   60 minutes   Treatment Type: Individual Therapy  Reported Symptoms: sadness, stress  Mental Status Exam: Appearance:  Casual     Behavior: Appropriate  Motor: Normal  Speech/Language:  Normal Rate  Affect: Appropriate and Tearful  Mood: normal  Thought process: normal  Thought content:   WNL  Sensory/Perceptual disturbances:   WNL  Orientation: oriented to person, place, time/date, and situation  Attention: Good  Concentration: Good  Memory: WNL  Fund of knowledge:  Good  Insight:   Good  Judgment:  Good  Impulse Control: Good   Risk Assessment: Danger to Self:  No Self-injurious Behavior: No Danger to Others: No Duty to Warn:no Physical Aggression / Violence:No  Access to Firearms a concern: No  Gang Involvement:No   Subjective: Pt present for face-to-face individual therapy via video Webex.  Pt consents to telehealth video therapy due to COVID 19 pandemic. Location of pt: home. Location of therapist: home office.  Pt talked about having a bad mammogram.  She may have a lump on her right breast.  She will have an ultrasound on March 8th.  Pt is worried about it and it is hard to wait.  Pt has a good friend who has breast cancer.  Pt was tearful as she talked about feeling scared about having cancer.   Helped pt with worry management.  Pt states she has never done well with waiting and not knowing.   Pt states she feels like every time things start to go ok something bad happens.   Pt talked about her daughter Mackenzie Khan.  Mackenzie Khan is going to school and her behavior has improved.   Pt talked about her mother who has dementia.  She does not know who pt is.  [name] mother is at [facility] and receiving good care but it is very sad seeing her mother in the condition she is in.  Pt does not go as much to see her mother  and does not take her kids to see her.  Pt feels like she is waiting for her mother to die but she keeps lingering.   Pt states her mother would hate being in this condition.   Pt is questioning her faith and wondering why God won't "take her mother".  Helped pt process her feelings.   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  Pt is taking classes she has to take online.  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  She has been worried that she was not smart enough anymore but she has been doing well in the classes.   Worked with pt on healthy coping strategies.  Worked with pt on self care strategies.    Provided supportive therapy.     Interventions: Cognitive Behavioral Therapy and Insight-Oriented  Diagnosis: F43.21  Plan: See pt's Treatment Plan for depression in Therapy Charts.  (Treatment Plan Target Date: 03/09/2022) Pt is progressing toward treatment goals.   Plan to continue to see pt every two weeks.    Jeremey Bascom, LCSW

## 2021-07-14 ENCOUNTER — Ambulatory Visit
Admission: RE | Admit: 2021-07-14 | Discharge: 2021-07-14 | Disposition: A | Discharge status: Home / Self Care | Payer: BC Managed Care – PPO | Source: Ambulatory Visit | Attending: Obstetrics and Gynecology | Admitting: Obstetrics and Gynecology

## 2021-07-14 DIAGNOSIS — R928 Other abnormal and inconclusive findings on diagnostic imaging of breast: Secondary | ICD-10-CM

## 2021-08-04 ENCOUNTER — Ambulatory Visit (INDEPENDENT_AMBULATORY_CARE_PROVIDER_SITE_OTHER): Payer: BC Managed Care – PPO | Admitting: Psychology

## 2021-08-04 DIAGNOSIS — F4321 Adjustment disorder with depressed mood: Secondary | ICD-10-CM

## 2021-08-04 NOTE — Progress Notes (Signed)
[facility] Counselor/Therapist Progress Note ? ?Patient ID: Mackenzie Khan, MRN: [id number],   ? ?Date: 08/04/2021 ? ?Time Spent:  2:00pm-2:55pm   55 minutes  ? ?Treatment Type: Individual Therapy ? ?Reported Symptoms: sadness, stress ? ?Mental Status Exam: ?Appearance:  Casual     ?Behavior: Appropriate  ?Motor: Normal  ?Speech/Language:  Normal Rate  ?Affect: Appropriate and Tearful  ?Mood: normal  ?Thought process: normal  ?Thought content:   WNL  ?Sensory/Perceptual disturbances:   WNL  ?Orientation: oriented to person, place, time/date, and situation  ?Attention: Good  ?Concentration: Good  ?Memory: WNL  ?Fund of knowledge:  Good  ?Insight:   Good  ?Judgment:  Good  ?Impulse Control: Good  ? ?Risk Assessment: ?Danger to Self:  No ?Self-injurious Behavior: No ?Danger to Others: No ?Duty to Warn:no ?Physical Aggression / Violence:No  ?Access to Firearms a concern: No  ?Gang Involvement:No  ? ?Subjective: Pt present for face-to-face individual therapy via video Webex.  Pt consents to telehealth video therapy due to COVID 19 pandemic. ?Location of pt: home. ?Location of therapist: home office.  ?Pt talked about her health.  The ultrasound results were good and she does not need any more follow up.  Pt is relieved that there is not any breast cancer concern.   ?Pt talked about [name] who has been difficult the past two weeks.  [name] has been refusing to do things.  [name] has been going to school which pt is grateful for.  Pt blames herself for the issues that [name] has bc [name] did not feel validated enough.  Addressed how hard pt is on herself and that she is assigning blame to herself that does not belong.   Helped pt process her feelings and worked on parenting strategies.   ?Pt talked about her brother and how challenging he has been to deal with throughout the years.  Pt sees similarities between her brother and [name]. Pt worries that [name] will turn out like her  brother.   Addressed pt's worries and concerns.  ?Worked with pt on healthy coping strategies.  Worked with pt on self care strategies.    ?Provided supportive therapy.    ? ?Interventions: Cognitive Behavioral Therapy and Insight-Oriented ? ?Diagnosis: F43.21 ? ?Plan: See pt's Treatment Plan for depression in Therapy Charts.  (Treatment Plan Target Date: 03/09/2022) ?Pt is progressing toward treatment goals.   ?Plan to continue to see pt every two weeks.   ? ?Roben Schliep, LCSW ? ? ? ?

## 2021-08-24 ENCOUNTER — Ambulatory Visit (INDEPENDENT_AMBULATORY_CARE_PROVIDER_SITE_OTHER): Payer: BC Managed Care – PPO | Admitting: Psychology

## 2021-08-24 DIAGNOSIS — F4321 Adjustment disorder with depressed mood: Secondary | ICD-10-CM | POA: Diagnosis not present

## 2021-08-24 NOTE — Progress Notes (Signed)
[facility] Counselor/Therapist Progress Note ? ?Patient ID: Mackenzie Khan, MRN: [id number],   ? ?Date: 08/24/2021 ? ?Time Spent:  11:00am-11:55am   55 minutes  ? ?Treatment Type: Individual Therapy ? ?Reported Symptoms: sadness, stress ? ?Mental Status Exam: ?Appearance:  Casual     ?Behavior: Appropriate  ?Motor: Normal  ?Speech/Language:  Normal Rate  ?Affect: Appropriate and Tearful  ?Mood: normal  ?Thought process: normal  ?Thought content:   WNL  ?Sensory/Perceptual disturbances:   WNL  ?Orientation: oriented to person, place, time/date, and situation  ?Attention: Good  ?Concentration: Good  ?Memory: WNL  ?Fund of knowledge:  Good  ?Insight:   Good  ?Judgment:  Good  ?Impulse Control: Good  ? ?Risk Assessment: ?Danger to Self:  No ?Self-injurious Behavior: No ?Danger to Others: No ?Duty to Warn:no ?Physical Aggression / Violence:No  ?Access to Firearms a concern: No  ?Gang Involvement:No  ? ?Subjective: Pt present for face-to-face individual therapy via video Webex.  Pt consents to telehealth video therapy due to COVID 19 pandemic. ?Location of pt: home. ?Location of therapist: home office.  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  Pt feels proud of herself for taking this step forward.  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   ?Pt talked about the stress of making plans for middle school for her son [name] in the next year. ?Last week was spring break and pt states it was "aweful".  This makes pt dread the summer.  [name] daughter [name] was very challenging to be with.  Pt states she loves [name] but does not like being around her.   [name] has quit all of her activities.  Pt feels upset that [name] is not involved with anything other than watching YouTube videos.  If pt pushes [name] to do things Rachel blows up.  Helped pt process her feelings and family dynamics.  Worked on parenting  strategies.   ?Pt talked about how it is hard to feel good when her mother is dying from alzheimers and her daughter is so sad and struggling.  Pt visits her mother 2-3 days a week at the nursing home.  [name] mother sleeps most of the day and eats pureed food.  Pt was tearful as she talked about how hard it is to see her mother "disappear".   [name] father still visit's pt's mother but he also has a girlfriend now and has moved on with his life to a certain extent.   ?Addressed the complexity of the situation.   ?Worked with pt on healthy coping strategies.  Worked with pt on self care strategies.    ?Provided supportive therapy.    ? ?Interventions: Cognitive Behavioral Therapy and Insight-Oriented ? ?Diagnosis: F43.21 ? ?Plan: See pt's Treatment Plan for depression in Therapy Charts.  (Treatment Plan Target Date: 03/09/2022) ?Pt is progressing toward treatment goals.   ?Plan to continue to see pt every two weeks.   ? ?Keora Eccleston, LCSW ? ? ? ?

## 2021-09-06 ENCOUNTER — Ambulatory Visit (INDEPENDENT_AMBULATORY_CARE_PROVIDER_SITE_OTHER): Payer: BC Managed Care – PPO | Admitting: Psychology

## 2021-09-06 DIAGNOSIS — F4321 Adjustment disorder with depressed mood: Secondary | ICD-10-CM | POA: Diagnosis not present

## 2021-09-06 NOTE — Progress Notes (Signed)
[facility] Counselor/Therapist Progress Note ? ?Patient ID: Mackenzie Khan, MRN: [id number],   ? ?Date: 09/06/2021 ? ?Time Spent:  12:00pm-12:55pm   55 minutes  ? ?Treatment Type: Individual Therapy ? ?Reported Symptoms: sadness, stress ? ?Mental Status Exam: ?Appearance:  Casual     ?Behavior: Appropriate  ?Motor: Normal  ?Speech/Language:  Normal Rate  ?Affect: Appropriate and Tearful  ?Mood: normal  ?Thought process: normal  ?Thought content:   WNL  ?Sensory/Perceptual disturbances:   WNL  ?Orientation: oriented to person, place, time/date, and situation  ?Attention: Good  ?Concentration: Good  ?Memory: WNL  ?Fund of knowledge:  Good  ?Insight:   Good  ?Judgment:  Good  ?Impulse Control: Good  ? ?Risk Assessment: ?Danger to Self:  No ?Self-injurious Behavior: No ?Danger to Others: No ?Duty to Warn:no ?Physical Aggression / Violence:No  ?Access to Firearms a concern: No  ?Gang Involvement:No  ? ?Subjective:  ?Pt present for face-to-face individual therapy via video Webex.  Pt consents to telehealth video therapy due to COVID 19 pandemic. ?Location of pt: home. ?Location of therapist: home office.  ?Pt went to an aquatics class today and it was harder than she thought.  She plans to do the aquatics classes daily for a month.   Pt is trying to improve her health.   ?Pt talked about being nervous about the summer with the kids since there will be so much unstructured time.   Rachel's therapist recommended that they go "tech free for a month".   Pt plans to do that in September.   Pt states she feels "stuck" with her family and feels like they have a lot of bad habits.  Pt feels like her kids do not do much other than electronics.  Pt feels disappointed that her kids are not what she hoped for them to be regarding involvement.   Helped pt identify what issues can be worked on.  Also worked on issues of acceptance.  Helped pt process her feelings and family dynamics.  Worked on parenting  strategies.   ?Pt talked about her relationship with her husband.   She would like for him to engage in things that she enjoys doing.  Addressed how pt can ask him to engage in an activity with her once a month.   ?Worked with pt on healthy coping strategies.  Worked with pt on self care strategies.    ?Provided supportive therapy.    ? ?Interventions: Cognitive Behavioral Therapy and Insight-Oriented ? ?Diagnosis: F43.21 ? ?Plan: See pt's Treatment Plan for depression in Therapy Charts.  (Treatment Plan Target Date: 03/09/2022) ?Pt is progressing toward treatment goals.   ?Plan to continue to see pt every two weeks.   ? ?Thompson Mckim, LCSW ? ? ? ?

## 2021-09-10 ENCOUNTER — Ambulatory Visit: Payer: BC Managed Care – PPO | Admitting: Psychology

## 2021-10-21 ENCOUNTER — Ambulatory Visit (INDEPENDENT_AMBULATORY_CARE_PROVIDER_SITE_OTHER): Payer: BC Managed Care – PPO | Admitting: Psychology

## 2021-10-21 DIAGNOSIS — F4321 Adjustment disorder with depressed mood: Secondary | ICD-10-CM | POA: Diagnosis not present

## 2021-10-21 NOTE — Progress Notes (Signed)
[facility] Counselor/Therapist Progress Note  Patient ID: Mackenzie Khan, MRN: [id number],    Date: 10/21/2021  Time Spent:  9:00am-9:55am   55 minutes   Treatment Type: Individual Therapy  Reported Symptoms: sadness, stress  Mental Status Exam: Appearance:  Casual     Behavior: Appropriate  Motor: Normal  Speech/Language:  Normal Rate  Affect: Appropriate and Tearful  Mood: normal  Thought process: normal  Thought content:   WNL  Sensory/Perceptual disturbances:   WNL  Orientation: oriented to person, place, time/date, and situation  Attention: Good  Concentration: Good  Memory: WNL  Fund of knowledge:  Good  Insight:   Good  Judgment:  Good  Impulse Control: Good   Risk Assessment: Danger to Self:  No Self-injurious Behavior: No Danger to Others: No Duty to Warn:no Physical Aggression / Violence:No  Access to Firearms a concern: No  Gang Involvement:No   Subjective:  Pt present for face-to-face individual therapy via video Webex.  Pt consents to telehealth video therapy due to COVID 19 pandemic. Location of pt: home. Location of therapist: home office.  Pt was tearful as she talked about gaining a lot of weight this past year.   She is really feeling the impact of her weight gain.   Pt feels like she is really big and has gained 2 sizes in clothes.   Pt is feeling badly about herself.   Pt feels pressure to lose weight and feels overwhelmed.   Worked with pt on healthy coping strategies.  Worked with pt on self care strategies. Pt talked about not sleeping well.   She can get to sleep but she wakes up at 3am or 5am and starts to worry.  Addressed pt's sleep issues and that part of what disrupts her sleep is that [name] still sleeps with her.  Pt is setting [name] up in a new bedroom that is next to hers in the hopes that [name] will sleep in there.   Pt talked about her relationship with her husband Arlys John.   She states things are not good with  Arlys John.   Pt has felt resentful and frustrated about some issues for years.  Pt feels like Arlys John does not listen to her.   Addressed the issues.   [name] daughter acts like Arlys John and all good and pt is all bad.  This is difficult for pt to deal with.   Helped pt process her feelings and relationship dynamics.     Provided supportive therapy.     Interventions: Cognitive Behavioral Therapy and Insight-Oriented  Diagnosis: F43.21  Plan: See pt's Treatment Plan for depression in Therapy Charts.  (Treatment Plan Target Date: 03/09/2022) Pt is progressing toward treatment goals.   Plan to continue to see pt every two weeks.    Jabree Pernice, LCSW

## 2021-11-04 ENCOUNTER — Ambulatory Visit (INDEPENDENT_AMBULATORY_CARE_PROVIDER_SITE_OTHER): Payer: BC Managed Care – PPO | Admitting: Psychology

## 2021-11-04 DIAGNOSIS — F4321 Adjustment disorder with depressed mood: Secondary | ICD-10-CM | POA: Diagnosis not present

## 2021-11-04 NOTE — Progress Notes (Signed)
[facility] Counselor/Therapist Progress Note  Patient ID: Mackenzie Khan, MRN: [id number],    Date: 11/04/2021  Time Spent:  9:00am-9:55am   55 minutes   Treatment Type: Individual Therapy  Reported Symptoms: sadness, stress  Mental Status Exam: Appearance:  Casual     Behavior: Appropriate  Motor: Normal  Speech/Language:  Normal Rate  Affect: Appropriate and Tearful  Mood: normal  Thought process: normal  Thought content:   WNL  Sensory/Perceptual disturbances:   WNL  Orientation: oriented to person, place, time/date, and situation  Attention: Good  Concentration: Good  Memory: WNL  Fund of knowledge:  Good  Insight:   Good  Judgment:  Good  Impulse Control: Good   Risk Assessment: Danger to Self:  No Self-injurious Behavior: No Danger to Others: No Duty to Warn:no Physical Aggression / Violence:No  Access to Firearms a concern: No  Gang Involvement:No   Subjective:  Pt present for face-to-face individual therapy via video Webex.  Pt consents to telehealth video therapy due to COVID 19 pandemic. Location of pt: home. Location of therapist: home office.  Pt talked about the summer being stressful.   Arranging activities for the kids has been challenging.   [name] went to girl scout camp last week and it was "aweful" for her and she did not make any friends there.   Pt was very disappointed that things did not go well for [facility].  [name] has been difficult to deal with since being home from camp.   Helped pt process her feelings and relationship dynamics.    Pt talked about having anxiety about flying.  Addressed how the feeling of having no control is difficult for her.  Addressed the thoughts that heighten her fear and anxiety.  Pt feels a lot of shame about her fear of flying.  Pt was tearful as she talked about her fear of flying and how it has impacted her life.  She has avoided going some places.   Identified pt's catastrophic thoughts and  worked on thought reframing.    Worked on calming strategies. Pt talked about her weight.  She has gained a lot of weight and wants to work on losing 5 lbs a month.  Pt is going to start exercising each day.  Pt has a goal to le 60 lbs.  Worked on healthy coping strategies.   Provided supportive therapy.     Interventions: Cognitive Behavioral Therapy and Insight-Oriented  Diagnosis: F43.21  Plan: See pt's Treatment Plan for depression in Therapy Charts.  (Treatment Plan Target Date: 03/09/2022) Pt is progressing toward treatment goals.   Plan to continue to see pt every two weeks.    Danyon Mcginness, LCSW

## 2021-11-17 ENCOUNTER — Ambulatory Visit (INDEPENDENT_AMBULATORY_CARE_PROVIDER_SITE_OTHER): Payer: BC Managed Care – PPO | Admitting: Psychology

## 2021-11-17 DIAGNOSIS — F4321 Adjustment disorder with depressed mood: Secondary | ICD-10-CM

## 2021-11-17 NOTE — Progress Notes (Signed)
[facility] Counselor/Therapist Progress Note  Patient ID: DEMYA SCRUGGS, MRN: [id number],    Date: 11/17/2021  Time Spent:  12:00am-12:55am   55 minutes   Treatment Type: Individual Therapy  Reported Symptoms: anxiety, stress  Mental Status Exam: Appearance:  Casual     Behavior: Appropriate  Motor: Normal  Speech/Language:  Normal Rate  Affect: Appropriate and Tearful  Mood: normal  Thought process: normal  Thought content:   WNL  Sensory/Perceptual disturbances:   WNL  Orientation: oriented to person, place, time/date, and situation  Attention: Good  Concentration: Good  Memory: WNL  Fund of knowledge:  Good  Insight:   Good  Judgment:  Good  Impulse Control: Good   Risk Assessment: Danger to Self:  No Self-injurious Behavior: No Danger to Others: No Duty to Warn:no Physical Aggression / Violence:No  Access to Firearms a concern: No  Gang Involvement:No   Subjective:  Pt present for face-to-face individual therapy via video Webex.  Pt consents to telehealth video therapy due to COVID 19 pandemic. Location of pt: home. Location of therapist: home office.  Pt talked about having anxiety and OCD tendencies.   Pt has anxiety about worrying about the safety of her kids.  Identified the "what if " thoughts pt experiences that impact her anxiety.   Pt states her OCD is ruminations.  She does not have compulsive behaviors.   Pt feels very bad about herself for having the thoughts and worries that she has.   Addressed pt's worries and thoughts and worked on thought reframing.   Worked on healthy coping strategies.   Provided supportive therapy.     Interventions: Cognitive Behavioral Therapy and Insight-Oriented  Diagnosis: F43.21  Plan: See pt's Treatment Plan for depression in Therapy Charts.  (Treatment Plan Target Date: 03/09/2022) Pt is progressing toward treatment goals.   Plan to continue to see pt every two weeks.    Devarious Pavek,  LCSW

## 2021-12-02 ENCOUNTER — Ambulatory Visit (INDEPENDENT_AMBULATORY_CARE_PROVIDER_SITE_OTHER): Payer: BC Managed Care – PPO | Admitting: Psychology

## 2021-12-02 DIAGNOSIS — F4321 Adjustment disorder with depressed mood: Secondary | ICD-10-CM | POA: Diagnosis not present

## 2021-12-02 NOTE — Progress Notes (Signed)
[facility] Counselor/Therapist Progress Note  Patient ID: Mackenzie Khan, MRN: [id number],    Date: 12/02/2021  Time Spent:  10:00am-10:55am    55 minutes   Treatment Type: Individual Therapy  Reported Symptoms:  stress  Mental Status Exam: Appearance:  Casual     Behavior: Appropriate  Motor: Normal  Speech/Language:  Normal Rate  Affect: Appropriate and Tearful  Mood: normal  Thought process: normal  Thought content:   WNL  Sensory/Perceptual disturbances:   WNL  Orientation: oriented to person, place, time/date, and situation  Attention: Good  Concentration: Good  Memory: WNL  Fund of knowledge:  Good  Insight:   Good  Judgment:  Good  Impulse Control: Good   Risk Assessment: Danger to Self:  No Self-injurious Behavior: No Danger to Others: No Duty to Warn:no Physical Aggression / Violence:No  Access to Firearms a concern: No  Gang Involvement:No   Subjective:  Pt present for face-to-face individual therapy via video Webex.  Pt consents to telehealth video therapy due to COVID 19 pandemic. Location of pt: home. Location of therapist: home office.  Pt talked about getting her kids cell phones and working to set up rules and restrictions.  This is very stressful for pt.  Addressed the parenting challenges with pt's kids.  Worked on parenting strategies.  Pt states [name] has been very dependent on pt to create a social world for her.   Addressed pt's expectations and looked at how she can adjust them and work "smarter rather than harder".   Pt feels very bad about herself for having the thoughts and worries that she has.   Addressed pt's worries and thoughts and worked on thought reframing.   Worked on healthy coping strategies.   Provided supportive therapy.     Interventions: Cognitive Behavioral Therapy and Insight-Oriented  Diagnosis: F43.21  Plan: See pt's Treatment Plan for depression in Therapy Charts.  (Treatment Plan Target Date:  03/09/2022) Pt is progressing toward treatment goals.   Plan to continue to see pt every two weeks.    Valen Gillison, LCSW

## 2021-12-30 ENCOUNTER — Ambulatory Visit: Payer: BC Managed Care – PPO | Admitting: Psychology

## 2022-01-13 ENCOUNTER — Ambulatory Visit (INDEPENDENT_AMBULATORY_CARE_PROVIDER_SITE_OTHER): Payer: BC Managed Care – PPO | Admitting: Psychology

## 2022-01-13 DIAGNOSIS — F4321 Adjustment disorder with depressed mood: Secondary | ICD-10-CM

## 2022-01-13 NOTE — Progress Notes (Signed)
[facility] Counselor/Therapist Progress Note  Patient ID: Mackenzie Khan, MRN: [id number],    Date: 01/13/2022  Time Spent:  10:00am-10:55am    55 minutes   Treatment Type: Individual Therapy  Reported Symptoms:  stress, anxiety  Mental Status Exam: Appearance:  Casual     Behavior: Appropriate  Motor: Normal  Speech/Language:  Normal Rate  Affect: Appropriate and Tearful  Mood: normal  Thought process: normal  Thought content:   WNL  Sensory/Perceptual disturbances:   WNL  Orientation: oriented to person, place, time/date, and situation  Attention: Good  Concentration: Good  Memory: WNL  Fund of knowledge:  Good  Insight:   Good  Judgment:  Good  Impulse Control: Good   Risk Assessment: Danger to Self:  No Self-injurious Behavior: No Danger to Others: No Duty to Warn:no Physical Aggression / Violence:No  Access to Firearms a concern: No  Gang Involvement:No   Subjective:  Mackenzie Khan present for face-to-face individual therapy via video Webex.  Mackenzie Khan consents to telehealth video therapy due to COVID 19 pandemic. Location of Mackenzie Khan: home. Location of therapist: home office.  Mackenzie Khan talked about her anxiety and OCD thoughts.   Mackenzie Khan has ADHD as well and feels frustrated by it.   Mackenzie Khan's PCP referred her to a psychiatrist to consider medication management of her symptoms.   Addressed Mackenzie Khan's thought ruminations.  She has fears and ruminations about her kids dying.   Helped Mackenzie Khan process her feelings and fears.  Worked with Mackenzie Khan on how to manage intrusive thoughts.   Mackenzie Khan was tearful as she talked about life feeling so scary.  She use to be an adventuresome person and she misses that part of herself.   Addressed Mackenzie Khan's fear of things that are beyond her control.    Mackenzie Khan has had fears and anxiety and OCD issues since she was 46 years old.   Mackenzie Khan is feeling tired and worn down.    Worked on healthy coping strategies.   Provided supportive therapy.     Interventions: Cognitive Behavioral  Therapy and Insight-Oriented  Diagnosis: F43.21  Plan: See Mackenzie Khan's Treatment Plan for depression in Therapy Charts.  (Treatment Plan Target Date: 03/09/2022) Mackenzie Khan is progressing toward treatment goals.   Plan to continue to see Mackenzie Khan every two weeks.    Zaire Vanbuskirk, LCSW

## 2022-02-01 ENCOUNTER — Ambulatory Visit (INDEPENDENT_AMBULATORY_CARE_PROVIDER_SITE_OTHER): Payer: BC Managed Care – PPO | Admitting: Psychology

## 2022-02-01 DIAGNOSIS — F4321 Adjustment disorder with depressed mood: Secondary | ICD-10-CM | POA: Diagnosis not present

## 2022-02-01 NOTE — Progress Notes (Signed)
[facility] Counselor/Therapist Progress Note  Patient ID: Mackenzie Khan, MRN: [id number],    Date: 02/01/2022  Time Spent:  9:00am-9:50am    50 minutes   Treatment Type: Individual Therapy  Reported Symptoms:  stress, anxiety  Mental Status Exam: Appearance:  Casual     Behavior: Appropriate  Motor: Normal  Speech/Language:  Normal Rate  Affect: Appropriate and Tearful  Mood: normal  Thought process: normal  Thought content:   WNL  Sensory/Perceptual disturbances:   WNL  Orientation: oriented to person, place, time/date, and situation  Attention: Good  Concentration: Good  Memory: WNL  Fund of knowledge:  Good  Insight:   Good  Judgment:  Good  Impulse Control: Good   Risk Assessment: Danger to Self:  No Self-injurious Behavior: No Danger to Others: No Duty to Warn:no Physical Aggression / Violence:No  Access to Firearms a concern: No  Gang Involvement:No   Subjective:  Pt present for face-to-face individual therapy via video Webex.  Pt consents to telehealth video therapy due to COVID 19 pandemic. Location of pt: home. Location of therapist: home office.  Pt talked about seeing a psychiatrist last week.  Pt was prescribed Lexapro and Ritalin.  Pt was also given Trazadone to sleep.  Pt has not taken the medication yet bc she feels disappointed about needing to be on medication again.  Helped pt process her thoughts and feelings. Pt talked about her brother and how complicated that relationship is.   Pt is dreading the holidays bc of family dynamics.   Addressed the issues with pt's brother.  He is heavily into scientology which pt disagrees with.  Pt talked about her concerns about her kids.  Apolonio Schneiders continues to not engage in much other than watching YouTube.  Pt worries about her children.  Addressed pt's worries and worked on parenting strategies and thought reframing.    Worked on healthy coping strategies.   Provided supportive therapy.      Interventions: Cognitive Behavioral Therapy and Insight-Oriented  Diagnosis: F43.21  Plan: See pt's Treatment Plan for depression in Therapy Charts.  (Treatment Plan Target Date: 03/09/2022) Pt is progressing toward treatment goals.   Plan to continue to see pt every two weeks.    Harshan Kearley, LCSW

## 2022-02-21 ENCOUNTER — Ambulatory Visit (INDEPENDENT_AMBULATORY_CARE_PROVIDER_SITE_OTHER): Payer: BC Managed Care – PPO | Admitting: Psychology

## 2022-02-21 DIAGNOSIS — F4321 Adjustment disorder with depressed mood: Secondary | ICD-10-CM

## 2022-02-21 NOTE — Progress Notes (Signed)
[facility] Counselor/Therapist Progress Note  Patient ID: Mackenzie Khan, MRN: [id number],    Date: 02/21/2022  Time Spent:  10:00am-10:50am    50 minutes   Treatment Type: Individual Therapy  Reported Symptoms:  stress, anxiety  Mental Status Exam: Appearance:  Casual     Behavior: Appropriate  Motor: Normal  Speech/Language:  Normal Rate  Affect: Appropriate and Tearful  Mood: normal  Thought process: normal  Thought content:   WNL  Sensory/Perceptual disturbances:   WNL  Orientation: oriented to person, place, time/date, and situation  Attention: Good  Concentration: Good  Memory: WNL  Fund of knowledge:  Good  Insight:   Good  Judgment:  Good  Impulse Control: Good   Risk Assessment: Danger to Self:  No Self-injurious Behavior: No Danger to Others: No Duty to Warn:no Physical Aggression / Violence:No  Access to Firearms a concern: No  Gang Involvement:No   Subjective:  Pt present for face-to-face individual therapy via video Webex.  Pt consents to telehealth video therapy due to COVID 19 pandemic. Location of pt: home. Location of therapist: home office.  Pt talked about starting her psychiatric medication.   Pt talked about waking up at 4am this morning and not being able to get back to sleep bc of worry and anxiety.   Pt has been walking the dog a lot which is positive for her.   Pt talked about her frustrations about her weight gain.   Addressed what pt can do to make healthier choices.   Worked on healthy coping strategies.  Pt talked about her mother.  She feels like she does not want to visit her mother anymore but she still visits her 2 times a week.[name] mother has end stage vascular dementia and is at [facility].  Hospice is involved with pt's mother's care.  Pt prays for God to take her mother since she does not have quality of life.   Addressed how sad it has been for pt to see her mother go through each stage of her  dementia.  Pt has been dealing with her mother's dementia for 6 years.     Provided supportive therapy.     Interventions: Cognitive Behavioral Therapy and Insight-Oriented  Diagnosis: F43.21  Plan: See pt's Treatment Plan for depression in Therapy Charts.  (Treatment Plan Target Date: 03/09/2022) Pt is progressing toward treatment goals.   Plan to continue to see pt every two weeks.    Dema Timmons, LCSW

## 2022-03-07 ENCOUNTER — Ambulatory Visit (INDEPENDENT_AMBULATORY_CARE_PROVIDER_SITE_OTHER): Payer: BC Managed Care – PPO | Admitting: Psychology

## 2022-03-07 DIAGNOSIS — F4321 Adjustment disorder with depressed mood: Secondary | ICD-10-CM

## 2022-03-07 NOTE — Progress Notes (Signed)
[place] Counselor/Therapist Progress Note  Patient ID: Mackenzie Khan, MRN: [id number],    Date: 03/07/2022 Time Spent:  10:00am-10:55am    55 minutes   Treatment Type: Individual Therapy  Reported Symptoms:  stress, anxiety  Mental Status Exam: Appearance:  Casual     Behavior: Appropriate  Motor: Normal  Speech/Language:  Normal Rate  Affect: Appropriate and Tearful  Mood: normal  Thought process: normal  Thought content:   WNL  Sensory/Perceptual disturbances:   WNL  Orientation: oriented to person, place, time/date, and situation  Attention: Good  Concentration: Good  Memory: WNL  Fund of knowledge:  Good  Insight:   Good  Judgment:  Good  Impulse Control: Good   Risk Assessment: Danger to Self:  No Self-injurious Behavior: No Danger to Others: No Duty to Warn:no Physical Aggression / Violence:No  Access to Firearms a concern: No  Gang Involvement:No   Subjective:  Pt present for face-to-face individual therapy via video Webex.  Pt consents to telehealth video therapy due to COVID 19 pandemic. Location of pt: home. Location of therapist: home office.  Pt talked about having trouble sleeping last night.  Her son was sick and the dog woke her up early as well.    Pt had a weekend boy scout camping trip with her son [name] which went well but was not restful.   There are parenting challenges with [name] who has high functioning autism and ADHD.  Addressed the parenting challenges and worked on parenting strategies.   Pt talked about feeling a need to get more routine and organization into her days.   Addressed how pt can incorporate routine that involves self care.  Pt feels very disorganized and unmotivated.  Addressed how small action steps can help to grow motivation.   Pt started taking Lexapro 10 mg and has been on it for 4 weeks but is not sure she is noticing improvement yet.   Pt talked about her mother and the complex feelings she has  regarding her mother's advanced stage of dementia.   Provided supportive therapy.     Interventions: Cognitive Behavioral Therapy and Insight-Oriented  Diagnosis: F43.21  Plan: See pt's Treatment Plan for depression in Therapy Charts.  (Treatment Plan Target Date: 03/09/2022) Pt is progressing toward treatment goals.   Plan to continue to see pt every two weeks.    [name], LCSW

## 2022-04-11 ENCOUNTER — Ambulatory Visit (INDEPENDENT_AMBULATORY_CARE_PROVIDER_SITE_OTHER): Payer: BC Managed Care – PPO | Admitting: Psychology

## 2022-04-11 DIAGNOSIS — F4321 Adjustment disorder with depressed mood: Secondary | ICD-10-CM

## 2022-04-11 NOTE — Progress Notes (Signed)
[facility] Counselor Initial Adult Exam  Name: Mackenzie Khan Date: 04/11/2022 MRN: [id number] DOB: [birth year] PCP: Teena Irani, PA-C  Time spent: 10:00am-10:55am    55 minutes  Guardian/Payee:  Donnamarie Poag requested: No   Reason for Visit /Presenting Problem: Pt present for face-to-face initial assessment update via video Webex.  Pt consents to telehealth video session due to COVID 19 pandemic. Location of pt: home Location of therapist: home office.  Pt continues to need therapy to address family issues and work on Pharmacologist.  [name] mother has end stage dementia. [name] husband's uncle died recently and unexpectedly.  Helped pt process her feelings.   Reviewed pt's treatment plan for annual update.  Updated pt's treatment plan and IA.    Pt participated in setting treatment goals.   Plan to continue to meet every two weeks.    Mental Status Exam: Appearance:   Casual     Behavior:  Appropriate  Motor:  Normal  Speech/Language:   Normal Rate  Affect:  Appropriate  Mood:  normal  Thought process:  normal  Thought content:    WNL  Sensory/Perceptual disturbances:    WNL  Orientation:  oriented to person, place, time/date, and situation  Attention:  Good  Concentration:  Good  Memory:  WNL  Fund of knowledge:   Good  Insight:    Good  Judgment:   Good  Impulse Control:  Good    Reported Symptoms:  sadness  Risk Assessment: Danger to Self:  No Self-injurious Behavior: No Danger to Others: No Duty to Warn:no Physical Aggression / Violence:No  Access to Firearms a concern: No  Gang Involvement:No  Patient / guardian was educated about steps to take if suicide or homicide risk level increases between visits: n/a While future psychiatric events cannot be accurately predicted, the patient does not currently require acute inpatient psychiatric care and does not currently meet [facility] involuntary commitment criteria.  Substance Abuse  History: Current substance abuse: No     Past Psychiatric History:   Previous psychological history is significant for depression Outpatient Providers:pt was in therapy with Colen Darling until [facility] retired. History of Psych Hospitalization: No  Psychological Testing:  n/a    Abuse History:  Victim of: No.,  n/a    Report needed: No. Victim of Neglect:No. Perpetrator of  n/a   Witness / Exposure to Domestic Violence: No   Protective Services Involvement: No  Witness to [employer] Violence:  No   Family History: No family history on file.  Living situation: the patient lives with their family. She lives with her husband and 3 children ages 87 yo twins and 55 yo son.    Pt grew up with both parents and a brother 5 years older.   Brother has history of substance abuse.   [name] mother has dementia.   Pt has a positive family history for mental illness and substance abuse.   Sexual Orientation: Straight  Relationship Status: married  Name of spouse / other:Brian If a parent, number of children / ages:pt has 3 children ages 50 yo twins and 10.  Support Systems: spouse parents  Financial Stress:  No   Income/Employment/Disability: Employment  Financial planner: No   Educational History: Education: Risk manager: Protestant  Any cultural differences that may affect / interfere with treatment:  not applicable   Recreation/Hobbies: reading  Stressors: Marital or family conflict    Strengths: Supportive Relationships, Hopefulness, Journalist, newspaper, and  Able to Communicate Effectively  Barriers:  none   Legal History: Pending legal issue / charges: The patient has no significant history of legal issues. History of legal issue / charges:  n/a  Medical History/Surgical History: reviewed Past Medical History:  Diagnosis Date   Depression    GERD (gastroesophageal reflux disease)    pregnancy related-pepcid/prilosec    Past Surgical  History:  Procedure Laterality Date   CESAREAN SECTION     CESAREAN SECTION  08/03/2011   Procedure: CESAREAN SECTION;  Surgeon: Daria Pastures, MD;  Location: [facility];  Service: Gynecology;  Laterality: N/A;  Repeat Cesarean Section Delivery  Boy  @ 0157,     tab  2007   WRIST FRACTURE SURGERY      Medications: Current Outpatient Medications  Medication Sig Dispense Refill   Drospirenone-Ethinyl Estradiol (NIKKI PO) Take 1 each by mouth once.     multivitamin-iron-minerals-folic acid (CENTRUM) chewable tablet Chew 1 tablet by mouth daily.     naproxen (NAPROSYN) 500 MG tablet Take 1 tablet (500 mg total) by mouth 2 (two) times daily. (Patient not taking: Reported on 08/14/2020) 30 tablet 0   oseltamivir (TAMIFLU) 75 MG capsule Take 75 mg by mouth daily. (Patient not taking: Reported on 08/14/2020)  0   terbinafine (LAMISIL) 250 MG tablet Please take one a day x 7days, repeat every 4 weeks x 4 months (Patient not taking: Reported on 08/14/2020) 28 tablet 0   terbinafine (LAMISIL) 250 MG tablet Please take one a day x 7days, repeat every 4 weeks x 4 months (Patient not taking: Reported on 08/14/2020) 28 tablet 0   terbinafine (LAMISIL) 250 MG tablet Please take one a day x 7days, repeat every 4 weeks x 4 months 28 tablet 0   valACYclovir (VALTREX) 1000 MG tablet Take 1,000 mg by mouth as needed.  (Patient not taking: Reported on 08/14/2020)  3   valACYclovir (VALTREX) 1000 MG tablet valacyclovir 1 gram tablet  TAKE 1 TABLET EVERY 12 HOURS     No current facility-administered medications for this visit.    Allergies  Allergen Reactions   Latex Itching    Vaginal irritation-itching , swelling   Sulfa Antibiotics     Lowers wbc     Diagnoses:  F43.21  Plan of Care: Treatment Plan Client Abilities/Strengths  Pt is bright, engaging, and motivated for therapy.   Client Treatment Preferences  Individual therapy.  Client Statement of Needs  Improve coping skills.  Symptoms  Depressed or  irritable mood. Low self-esteem. Unresolved grief issues.  Problems Addressed  Unipolar Depression Goals 1. Alleviate depressive symptoms and return to previous level of effective functioning. 2. Appropriately grieve the loss in order to normalize mood and to return to previously adaptive level of functioning. Objective Learn and implement behavioral strategies to overcome depression. Target Date: 2023-04-12 Frequency: Biweekly  Progress: 40 Modality: individual  Related Interventions Engage the client in "behavioral activation," increasing his/her activity level and contact with sources of reward, while identifying processes that inhibit activation.  Use behavioral techniques such as instruction, rehearsal, role-playing, role reversal, as needed, to facilitate activity in the client's daily life; reinforce success. Assist the client in developing skills that increase the likelihood of deriving pleasure from behavioral activation (e.g., assertiveness skills, developing an exercise plan, less internal/more external focus, increased social involvement); reinforce success. Objective Identify important people in life, past and present, and describe the quality, good and poor, of those relationships. Target Date: 2023-04-12 Frequency: Biweekly  Progress: 40  Modality: individual  Related Interventions Conduct Interpersonal Therapy beginning with the assessment of the client's "interpersonal inventory" of important past and present relationships; develop a case formulation linking depression to grief, interpersonal role disputes, role transitions, and/or interpersonal deficits). Objective Learn and implement problem-solving and decision-making skills. Target Date: 2023-04-12 Frequency: Biweekly  Progress: 40 Modality: individual  Related Interventions Conduct Problem-Solving Therapy using techniques such as psychoeducation, modeling, and role-playing to teach client problem-solving skills (i.e.,  defining a problem specifically, generating possible solutions, evaluating the pros and cons of each solution, selecting and implementing a plan of action, evaluating the efficacy of the plan, accepting or revising the plan); role-play application of the problem-solving skill to a real life issue. Encourage in the client the development of a positive problem orientation in which problems and solving them are viewed as a natural part of life and not something to be feared, despaired, or avoided. 3. Develop healthy interpersonal relationships that lead to the alleviation and help prevent the relapse of depression. 4. Develop healthy thinking patterns and beliefs about self, others, and the world that lead to the alleviation and help prevent the relapse of depression. 5. Recognize, accept, and cope with feelings of depression. Diagnosis F43.21 Conditions For Discharge Achievement of treatment goals and objectives    [name], LCSW

## 2022-05-13 ENCOUNTER — Ambulatory Visit (INDEPENDENT_AMBULATORY_CARE_PROVIDER_SITE_OTHER): Payer: BC Managed Care – PPO | Admitting: Psychology

## 2022-05-13 DIAGNOSIS — F4321 Adjustment disorder with depressed mood: Secondary | ICD-10-CM

## 2022-05-13 NOTE — Progress Notes (Signed)
[place] Counselor/Therapist Progress Note  Patient ID: CHRISHANA SPARGUR, MRN: [id number],    Date: 05/13/2022  Time Spent: 9:00am-9:55am   55 minutes   Treatment Type: Individual Therapy  Reported Symptoms: stress  Mental Status Exam: Appearance:  Casual     Behavior: Appropriate  Motor: Normal  Speech/Language:  Normal Rate  Affect: Appropriate  Mood: normal  Thought process: normal  Thought content:   WNL  Sensory/Perceptual disturbances:   WNL  Orientation: oriented to person, place, time/date, and situation  Attention: Good  Concentration: Good  Memory: WNL  Fund of knowledge:  Good  Insight:   Good  Judgment:  Good  Impulse Control: Good   Risk Assessment: Danger to Self:  No Self-injurious Behavior: No Danger to Others: No Duty to Warn:no Physical Aggression / Violence:No  Access to Firearms a concern: No  Gang Involvement:No   Subjective: Pt present for face-to-face individual therapy via video Webex.  Pt consents to telehealth video session due to COVID 19 pandemic. Location of pt: home Location of therapist: home office.   Pt talked about her son who was sick during Christmas.   He has had a rare rash and has had trouble sleeping.  Pt has gotten him medical attention and treatment but her sleep has been disrupted as well.  Pt states she is exhausted and tired.   Pt talked about her mother who has end state dementia and has had it for 6 years.   They had a care plan meeting with [facility] to address end of life options.  [name] father chronicled pt's mother illness the past 6 years and that stirred things up for pt.  They talked about what they can do to help pt's mother not linger and be able to pass peacefully.   Pt states it was very emotional.   Helped pt process her feelings.   Worked on increasing self care. Provided supportive therapy.    Interventions: Cognitive Behavioral Therapy and Insight-Oriented  Diagnosis: F43.21  Plan:  Plan of Care: Treatment Plan Client Abilities/Strengths  Pt is bright, engaging, and motivated for therapy.   Client Treatment Preferences  Individual therapy.  Client Statement of Needs  Improve coping skills.  Symptoms  Depressed or irritable mood. Low self-esteem. Unresolved grief issues.  Problems Addressed  Unipolar Depression Goals 1. Alleviate depressive symptoms and return to previous level of effective functioning. 2. Appropriately grieve the loss in order to normalize mood and to return to previously adaptive level of functioning. Objective Learn and implement behavioral strategies to overcome depression. Target Date: 2023-04-12 Frequency: Biweekly  Progress: 40 Modality: individual  Related Interventions Engage the client in "behavioral activation," increasing his/her activity level and contact with sources of reward, while identifying processes that inhibit activation.  Use behavioral techniques such as instruction, rehearsal, role-playing, role reversal, as needed, to facilitate activity in the client's daily life; reinforce success. Assist the client in developing skills that increase the likelihood of deriving pleasure from behavioral activation (e.g., assertiveness skills, developing an exercise plan, less internal/more external focus, increased social involvement); reinforce success. Objective Identify important people in life, past and present, and describe the quality, good and poor, of those relationships. Target Date: 2023-04-12 Frequency: Biweekly  Progress: 40 Modality: individual  Related Interventions Conduct Interpersonal Therapy beginning with the assessment of the client's "interpersonal inventory" of important past and present relationships; develop a case formulation linking depression to grief, interpersonal role disputes, role transitions, and/or interpersonal deficits). Objective Learn and implement problem-solving and  decision-making skills. Target  Date: 2023-04-12 Frequency: Biweekly  Progress: 40 Modality: individual  Related Interventions Conduct Problem-Solving Therapy using techniques such as psychoeducation, modeling, and role-playing to teach client problem-solving skills (i.e., defining a problem specifically, generating possible solutions, evaluating the pros and cons of each solution, selecting and implementing a plan of action, evaluating the efficacy of the plan, accepting or revising the plan); role-play application of the problem-solving skill to a real life issue. Encourage in the client the development of a positive problem orientation in which problems and solving them are viewed as a natural part of life and not something to be feared, despaired, or avoided. 3. Develop healthy interpersonal relationships that lead to the alleviation and help prevent the relapse of depression. 4. Develop healthy thinking patterns and beliefs about self, others, and the world that lead to the alleviation and help prevent the relapse of depression. 5. Recognize, accept, and cope with feelings of depression. Diagnosis F43.21 Conditions For Discharge Achievement of treatment goals and objectives   [name], LCSW

## 2022-06-02 ENCOUNTER — Ambulatory Visit (INDEPENDENT_AMBULATORY_CARE_PROVIDER_SITE_OTHER): Payer: BC Managed Care – PPO | Admitting: Psychology

## 2022-06-02 DIAGNOSIS — F4321 Adjustment disorder with depressed mood: Secondary | ICD-10-CM

## 2022-06-02 NOTE — Progress Notes (Signed)
[place] Counselor/Therapist Progress Note  Patient ID: EVALYNNE LOCURTO, MRN: [id number],    Date: 06/02/2022  Time Spent: 9:00am-9:55am   55 minutes   Treatment Type: Individual Therapy  Reported Symptoms: stress  Mental Status Exam: Appearance:  Casual     Behavior: Appropriate  Motor: Normal  Speech/Language:  Normal Rate  Affect: Appropriate  Mood: normal  Thought process: normal  Thought content:   WNL  Sensory/Perceptual disturbances:   WNL  Orientation: oriented to person, place, time/date, and situation  Attention: Good  Concentration: Good  Memory: WNL  Fund of knowledge:  Good  Insight:   Good  Judgment:  Good  Impulse Control: Good   Risk Assessment: Danger to Self:  No Self-injurious Behavior: No Danger to Others: No Duty to Warn:no Physical Aggression / Violence:No  Access to Firearms a concern: No  Gang Involvement:No   Subjective: Pt present for face-to-face individual therapy via video Webex.  Pt consents to telehealth video session due to COVID 19 pandemic. Location of pt: home Location of therapist: home office.   Pt talked about her mother dying last Monday.  Pt is very sad and grieving.   She is relieved for her mother that she is no longer lingering with end stage dementia.  Pt talked about how her mother died.  Pt states it was awful to watch her mother die.  It did not seem like a peaceful death.  Pt was there the whole time.   Helped pt process her feelings and grief.   Pt had to clean out her mother's room at [facility] and going through her things was difficult.   Pt will have a celebration of life for her mother in March.   Worked on increasing self care. Provided supportive therapy.    Interventions: Cognitive Behavioral Therapy and Insight-Oriented  Diagnosis: F43.21  Plan: Plan of Care: Treatment Plan Client Abilities/Strengths  Pt is bright, engaging, and motivated for therapy.   Client Treatment  Preferences  Individual therapy.  Client Statement of Needs  Improve coping skills.  Symptoms  Depressed or irritable mood. Low self-esteem. Unresolved grief issues.  Problems Addressed  Unipolar Depression Goals 1. Alleviate depressive symptoms and return to previous level of effective functioning. 2. Appropriately grieve the loss in order to normalize mood and to return to previously adaptive level of functioning. Objective Learn and implement behavioral strategies to overcome depression. Target Date: 2023-04-12 Frequency: Biweekly  Progress: 40 Modality: individual  Related Interventions Engage the client in "behavioral activation," increasing his/her activity level and contact with sources of reward, while identifying processes that inhibit activation.  Use behavioral techniques such as instruction, rehearsal, role-playing, role reversal, as needed, to facilitate activity in the client's daily life; reinforce success. Assist the client in developing skills that increase the likelihood of deriving pleasure from behavioral activation (e.g., assertiveness skills, developing an exercise plan, less internal/more external focus, increased social involvement); reinforce success. Objective Identify important people in life, past and present, and describe the quality, good and poor, of those relationships. Target Date: 2023-04-12 Frequency: Biweekly  Progress: 40 Modality: individual  Related Interventions Conduct Interpersonal Therapy beginning with the assessment of the client's "interpersonal inventory" of important past and present relationships; develop a case formulation linking depression to grief, interpersonal role disputes, role transitions, and/or interpersonal deficits). Objective Learn and implement problem-solving and decision-making skills. Target Date: 2023-04-12 Frequency: Biweekly  Progress: 40 Modality: individual  Related Interventions Conduct Problem-Solving Therapy  using techniques such as psychoeducation, modeling, and  role-playing to teach client problem-solving skills (i.e., defining a problem specifically, generating possible solutions, evaluating the pros and cons of each solution, selecting and implementing a plan of action, evaluating the efficacy of the plan, accepting or revising the plan); role-play application of the problem-solving skill to a real life issue. Encourage in the client the development of a positive problem orientation in which problems and solving them are viewed as a natural part of life and not something to be feared, despaired, or avoided. 3. Develop healthy interpersonal relationships that lead to the alleviation and help prevent the relapse of depression. 4. Develop healthy thinking patterns and beliefs about self, others, and the world that lead to the alleviation and help prevent the relapse of depression. 5. Recognize, accept, and cope with feelings of depression. Diagnosis F43.21 Conditions For Discharge Achievement of treatment goals and objectives   [name], LCSW

## 2022-06-17 ENCOUNTER — Ambulatory Visit (INDEPENDENT_AMBULATORY_CARE_PROVIDER_SITE_OTHER): Payer: BC Managed Care – PPO | Admitting: Psychology

## 2022-06-17 DIAGNOSIS — F4321 Adjustment disorder with depressed mood: Secondary | ICD-10-CM | POA: Diagnosis not present

## 2022-06-17 NOTE — Progress Notes (Signed)
[facility] Counselor/Therapist Progress Note  Patient ID: Mackenzie Khan, MRN: [id number],    Date: 06/17/2022  Time Spent: 10:00am-10:55am   55 minutes   Treatment Type: Individual Therapy  Reported Symptoms: stress  Mental Status Exam: Appearance:  Casual     Behavior: Appropriate  Motor: Normal  Speech/Language:  Normal Rate  Affect: Appropriate  Mood: normal  Thought process: normal  Thought content:   WNL  Sensory/Perceptual disturbances:   WNL  Orientation: oriented to person, place, time/date, and situation  Attention: Good  Concentration: Good  Memory: WNL  Fund of knowledge:  Good  Insight:   Good  Judgment:  Good  Impulse Control: Good   Risk Assessment: Danger to Self:  No Self-injurious Behavior: No Danger to Others: No Duty to Warn:no Physical Aggression / Violence:No  Access to Firearms a concern: No  Gang Involvement:No   Subjective: Pt present for face-to-face individual therapy via video Webex.  Pt consents to telehealth video session due to COVID 19 pandemic. Location of pt: home Location of therapist: home office.   Pt talked about her grief regarding the death of her mother.   Pt states she "aches".  Pt states she feels like her mother is "watching over her".  Helped pt process her feelings and grief.    Pt has some bitterness toward her brother bc he has been able to experience their mother's illness and death from a distance.   [name] brother wants to write a book about their mother.  Pt has distanced from her brother.   Helped pt process her relationship with her brother.   Worked on increasing self care. Provided supportive therapy.    Interventions: Cognitive Behavioral Therapy and Insight-Oriented  Diagnosis: F43.21  Plan: Plan of Care: Treatment Plan Client Abilities/Strengths  Pt is bright, engaging, and motivated for therapy.   Client Treatment Preferences  Individual therapy.  Client Statement of Needs  Improve  coping skills.  Symptoms  Depressed or irritable mood. Low self-esteem. Unresolved grief issues.  Problems Addressed  Unipolar Depression Goals 1. Alleviate depressive symptoms and return to previous level of effective functioning. 2. Appropriately grieve the loss in order to normalize mood and to return to previously adaptive level of functioning. Objective Learn and implement behavioral strategies to overcome depression. Target Date: 2023-04-12 Frequency: Biweekly  Progress: 40 Modality: individual  Related Interventions Engage the client in "behavioral activation," increasing his/her activity level and contact with sources of reward, while identifying processes that inhibit activation.  Use behavioral techniques such as instruction, rehearsal, role-playing, role reversal, as needed, to facilitate activity in the client's daily life; reinforce success. Assist the client in developing skills that increase the likelihood of deriving pleasure from behavioral activation (e.g., assertiveness skills, developing an exercise plan, less internal/more external focus, increased social involvement); reinforce success. Objective Identify important people in life, past and present, and describe the quality, good and poor, of those relationships. Target Date: 2023-04-12 Frequency: Biweekly  Progress: 40 Modality: individual  Related Interventions Conduct Interpersonal Therapy beginning with the assessment of the client's "interpersonal inventory" of important past and present relationships; develop a case formulation linking depression to grief, interpersonal role disputes, role transitions, and/or interpersonal deficits). Objective Learn and implement problem-solving and decision-making skills. Target Date: 2023-04-12 Frequency: Biweekly  Progress: 40 Modality: individual  Related Interventions Conduct Problem-Solving Therapy using techniques such as psychoeducation, modeling, and role-playing to  teach client problem-solving skills (i.e., defining a problem specifically, generating possible solutions, evaluating the pros and cons  of each solution, selecting and implementing a plan of action, evaluating the efficacy of the plan, accepting or revising the plan); role-play application of the problem-solving skill to a real life issue. Encourage in the client the development of a positive problem orientation in which problems and solving them are viewed as a natural part of life and not something to be feared, despaired, or avoided. 3. Develop healthy interpersonal relationships that lead to the alleviation and help prevent the relapse of depression. 4. Develop healthy thinking patterns and beliefs about self, others, and the world that lead to the alleviation and help prevent the relapse of depression. 5. Recognize, accept, and cope with feelings of depression. Diagnosis F43.21 Conditions For Discharge Achievement of treatment goals and objectives   [name], LCSW

## 2022-07-01 ENCOUNTER — Ambulatory Visit (INDEPENDENT_AMBULATORY_CARE_PROVIDER_SITE_OTHER): Payer: BC Managed Care – PPO | Admitting: Psychology

## 2022-07-01 DIAGNOSIS — F4321 Adjustment disorder with depressed mood: Secondary | ICD-10-CM

## 2022-07-01 NOTE — Progress Notes (Signed)
[facility] Counselor/Therapist Progress Note  Patient ID: Mackenzie Khan, MRN: [id number],    Date: 07/01/2022  Time Spent: 10:00am-10:55am   55 minutes   Treatment Type: Individual Therapy  Reported Symptoms: stress  Mental Status Exam: Appearance:  Casual     Behavior: Appropriate  Motor: Normal  Speech/Language:  Normal Rate  Affect: Appropriate  Mood: normal  Thought process: normal  Thought content:   WNL  Sensory/Perceptual disturbances:   WNL  Orientation: oriented to person, place, time/date, and situation  Attention: Good  Concentration: Good  Memory: WNL  Fund of knowledge:  Good  Insight:   Good  Judgment:  Good  Impulse Control: Good   Risk Assessment: Danger to Self:  No Self-injurious Behavior: No Danger to Others: No Duty to Warn:no Physical Aggression / Violence:No  Access to Firearms a concern: No  Gang Involvement:No   Subjective: Pt present for face-to-face individual therapy via video Webex.  Pt consents to telehealth video session due to COVID 19 pandemic. Location of pt: home Location of therapist: home office.   Pt talked about being sick most of the week.  She went to the doctor and was put on antibiotics.   Pt is upset bc she applied to get her son [name] into [employer] for the fall and they rejected him.  Addressed the information pt was given about why he was rejected.  Pt feels like it was not fair.   She is having to rally and apply to other schools for [name] which is stressful.   Pt talked about the loss of her mother.   Pt states she does not "feel her mother around her" and is upset about it bc she thought her mother would "give her strength" by her spiritual presence after she died.    Helped pt process her feelings and grief.   Worked on increasing self care. Provided supportive therapy.    Interventions: Cognitive Behavioral Therapy and Insight-Oriented  Diagnosis: F43.21  Plan: Plan of  Care: Treatment Plan Client Abilities/Strengths  Pt is bright, engaging, and motivated for therapy.   Client Treatment Preferences  Individual therapy.  Client Statement of Needs  Improve coping skills.  Symptoms  Depressed or irritable mood. Low self-esteem. Unresolved grief issues.  Problems Addressed  Unipolar Depression Goals 1. Alleviate depressive symptoms and return to previous level of effective functioning. 2. Appropriately grieve the loss in order to normalize mood and to return to previously adaptive level of functioning. Objective Learn and implement behavioral strategies to overcome depression. Target Date: 2023-04-12 Frequency: Biweekly  Progress: 40 Modality: individual  Related Interventions Engage the client in "behavioral activation," increasing his/her activity level and contact with sources of reward, while identifying processes that inhibit activation.  Use behavioral techniques such as instruction, rehearsal, role-playing, role reversal, as needed, to facilitate activity in the client's daily life; reinforce success. Assist the client in developing skills that increase the likelihood of deriving pleasure from behavioral activation (e.g., assertiveness skills, developing an exercise plan, less internal/more external focus, increased social involvement); reinforce success. Objective Identify important people in life, past and present, and describe the quality, good and poor, of those relationships. Target Date: 2023-04-12 Frequency: Biweekly  Progress: 40 Modality: individual  Related Interventions Conduct Interpersonal Therapy beginning with the assessment of the client's "interpersonal inventory" of important past and present relationships; develop a case formulation linking depression to grief, interpersonal role disputes, role transitions, and/or interpersonal deficits). Objective Learn and implement problem-solving and decision-making  skills. Target Date:  2023-04-12 Frequency: Biweekly  Progress: 40 Modality: individual  Related Interventions Conduct Problem-Solving Therapy using techniques such as psychoeducation, modeling, and role-playing to teach client problem-solving skills (i.e., defining a problem specifically, generating possible solutions, evaluating the pros and cons of each solution, selecting and implementing a plan of action, evaluating the efficacy of the plan, accepting or revising the plan); role-play application of the problem-solving skill to a real life issue. Encourage in the client the development of a positive problem orientation in which problems and solving them are viewed as a natural part of life and not something to be feared, despaired, or avoided. 3. Develop healthy interpersonal relationships that lead to the alleviation and help prevent the relapse of depression. 4. Develop healthy thinking patterns and beliefs about self, others, and the world that lead to the alleviation and help prevent the relapse of depression. 5. Recognize, accept, and cope with feelings of depression. Diagnosis F43.21 Conditions For Discharge Achievement of treatment goals and objectives   [name], LCSW

## 2022-07-15 ENCOUNTER — Ambulatory Visit (INDEPENDENT_AMBULATORY_CARE_PROVIDER_SITE_OTHER): Payer: BC Managed Care – PPO | Admitting: Psychology

## 2022-07-15 DIAGNOSIS — F4321 Adjustment disorder with depressed mood: Secondary | ICD-10-CM

## 2022-07-15 NOTE — Progress Notes (Signed)
[facility] Counselor/Therapist Progress Note  Patient ID: Mackenzie Khan, MRN: [id number],    Date: 07/15/2022  Time Spent: 10:00am-10:55am   55 minutes   Treatment Type: Individual Therapy  Reported Symptoms: stress  Mental Status Exam: Appearance:  Casual     Behavior: Appropriate  Motor: Normal  Speech/Language:  Normal Rate  Affect: Appropriate  Mood: normal  Thought process: normal  Thought content:   WNL  Sensory/Perceptual disturbances:   WNL  Orientation: oriented to person, place, time/date, and situation  Attention: Good  Concentration: Good  Memory: WNL  Fund of knowledge:  Good  Insight:   Good  Judgment:  Good  Impulse Control: Good   Risk Assessment: Danger to Self:  No Self-injurious Behavior: No Danger to Others: No Duty to Warn:no Physical Aggression / Violence:No  Access to Firearms a concern: No  Gang Involvement:No   Subjective: Pt present for face-to-face individual therapy via video Webex.  Pt consents to telehealth video session due to COVID 19 pandemic. Location of pt: home Location of therapist: home office.   Pt talked about her health.  She is anxious about getting her mammogram bc she is often called back for an ultrasound.  Addressed pt's worries and concerns.  Worked on calming strategies  Pt talked about the loss of her mother.    She is planning the funeral for her mother and there have been some issues dealing with pt's brother Randall Hiss.  Some family of origin dynamics have been triggered.  Helped pt process her feelings and  family issues and grief.   Worked on increasing self care. Provided supportive therapy.    Interventions: Cognitive Behavioral Therapy and Insight-Oriented  Diagnosis: F43.21  Plan: Plan of Care: Treatment Plan Client Abilities/Strengths  Pt is bright, engaging, and motivated for therapy.   Client Treatment Preferences  Individual therapy.  Client Statement of Needs  Improve coping  skills.  Symptoms  Depressed or irritable mood. Low self-esteem. Unresolved grief issues.  Problems Addressed  Unipolar Depression Goals 1. Alleviate depressive symptoms and return to previous level of effective functioning. 2. Appropriately grieve the loss in order to normalize mood and to return to previously adaptive level of functioning. Objective Learn and implement behavioral strategies to overcome depression. Target Date: 2023-04-12 Frequency: Biweekly  Progress: 40 Modality: individual  Related Interventions Engage the client in "behavioral activation," increasing his/her activity level and contact with sources of reward, while identifying processes that inhibit activation.  Use behavioral techniques such as instruction, rehearsal, role-playing, role reversal, as needed, to facilitate activity in the client's daily life; reinforce success. Assist the client in developing skills that increase the likelihood of deriving pleasure from behavioral activation (e.g., assertiveness skills, developing an exercise plan, less internal/more external focus, increased social involvement); reinforce success. Objective Identify important people in life, past and present, and describe the quality, good and poor, of those relationships. Target Date: 2023-04-12 Frequency: Biweekly  Progress: 40 Modality: individual  Related Interventions Conduct Interpersonal Therapy beginning with the assessment of the client's "interpersonal inventory" of important past and present relationships; develop a case formulation linking depression to grief, interpersonal role disputes, role transitions, and/or interpersonal deficits). Objective Learn and implement problem-solving and decision-making skills. Target Date: 2023-04-12 Frequency: Biweekly  Progress: 40 Modality: individual  Related Interventions Conduct Problem-Solving Therapy using techniques such as psychoeducation, modeling, and role-playing to teach  client problem-solving skills (i.e., defining a problem specifically, generating possible solutions, evaluating the pros and cons of each solution, selecting and implementing  a plan of action, evaluating the efficacy of the plan, accepting or revising the plan); role-play application of the problem-solving skill to a real life issue. Encourage in the client the development of a positive problem orientation in which problems and solving them are viewed as a natural part of life and not something to be feared, despaired, or avoided. 3. Develop healthy interpersonal relationships that lead to the alleviation and help prevent the relapse of depression. 4. Develop healthy thinking patterns and beliefs about self, others, and the world that lead to the alleviation and help prevent the relapse of depression. 5. Recognize, accept, and cope with feelings of depression. Diagnosis F43.21 Conditions For Discharge Achievement of treatment goals and objectives   [name], LCSW

## 2022-07-29 ENCOUNTER — Ambulatory Visit: Payer: BC Managed Care – PPO | Admitting: Psychology

## 2022-08-16 ENCOUNTER — Ambulatory Visit (INDEPENDENT_AMBULATORY_CARE_PROVIDER_SITE_OTHER): Payer: BC Managed Care – PPO | Admitting: Psychology

## 2022-08-16 DIAGNOSIS — F4321 Adjustment disorder with depressed mood: Secondary | ICD-10-CM | POA: Diagnosis not present

## 2022-08-16 NOTE — Progress Notes (Signed)
[facility] Counselor/Therapist Progress Note  Patient ID: SANTINA WYLER, MRN: [id number],    Date: 08/16/2022  Time Spent: 9:00am-9:55am   55 minutes   Treatment Type: Individual Therapy  Reported Symptoms: stress  Mental Status Exam: Appearance:  Casual     Behavior: Appropriate  Motor: Normal  Speech/Language:  Normal Rate  Affect: Appropriate  Mood: normal  Thought process: normal  Thought content:   WNL  Sensory/Perceptual disturbances:   WNL  Orientation: oriented to person, place, time/date, and situation  Attention: Good  Concentration: Good  Memory: WNL  Fund of knowledge:  Good  Insight:   Good  Judgment:  Good  Impulse Control: Good   Risk Assessment: Danger to Self:  No Self-injurious Behavior: No Danger to Others: No Duty to Warn:no Physical Aggression / Violence:No  Access to Firearms a concern: No  Gang Involvement:No   Subjective: Pt present for face-to-face individual therapy via video Webex.  Pt consents to telehealth video session due to COVID 19 pandemic. Location of pt: home Location of therapist: home office.   Pt talked about her son [name] who is still not sleeping through the night and is still sleeping with her.   Addressed how stressful this is for her.  Pt recently found out [name] got into [employer] for the fall.   Pt talked about the loss of her mother.    She had the funeral for her mother and it went much better than expected.   [name] brother Minerva Areola behaved ok which was a relief for pt.  Pt did the eulogy and 300 guests were there.  Pt felt like her eulogy went well.    Helped pt process her feelings and family issues and grief.   Pt is not sure what to do with her time now that the funeral is over.   Helped pt explore how she can fill her time in meaningful ways.    Worked on increasing self care. Provided supportive therapy.    Interventions: Cognitive Behavioral Therapy and Insight-Oriented  Diagnosis:  F43.21  Plan: Plan of Care: Treatment Plan Client Abilities/Strengths  Pt is bright, engaging, and motivated for therapy.   Client Treatment Preferences  Individual therapy.  Client Statement of Needs  Improve coping skills.  Symptoms  Depressed or irritable mood. Low self-esteem. Unresolved grief issues.  Problems Addressed  Unipolar Depression Goals 1. Alleviate depressive symptoms and return to previous level of effective functioning. 2. Appropriately grieve the loss in order to normalize mood and to return to previously adaptive level of functioning. Objective Learn and implement behavioral strategies to overcome depression. Target Date: 2023-04-12 Frequency: Biweekly  Progress: 40 Modality: individual  Related Interventions Engage the client in "behavioral activation," increasing his/her activity level and contact with sources of reward, while identifying processes that inhibit activation.  Use behavioral techniques such as instruction, rehearsal, role-playing, role reversal, as needed, to facilitate activity in the client's daily life; reinforce success. Assist the client in developing skills that increase the likelihood of deriving pleasure from behavioral activation (e.g., assertiveness skills, developing an exercise plan, less internal/more external focus, increased social involvement); reinforce success. Objective Identify important people in life, past and present, and describe the quality, good and poor, of those relationships. Target Date: 2023-04-12 Frequency: Biweekly  Progress: 40 Modality: individual  Related Interventions Conduct Interpersonal Therapy beginning with the assessment of the client's "interpersonal inventory" of important past and present relationships; develop a case formulation linking depression to grief, interpersonal role disputes, role  transitions, and/or interpersonal deficits). Objective Learn and implement problem-solving and decision-making  skills. Target Date: 2023-04-12 Frequency: Biweekly  Progress: 40 Modality: individual  Related Interventions Conduct Problem-Solving Therapy using techniques such as psychoeducation, modeling, and role-playing to teach client problem-solving skills (i.e., defining a problem specifically, generating possible solutions, evaluating the pros and cons of each solution, selecting and implementing a plan of action, evaluating the efficacy of the plan, accepting or revising the plan); role-play application of the problem-solving skill to a real life issue. Encourage in the client the development of a positive problem orientation in which problems and solving them are viewed as a natural part of life and not something to be feared, despaired, or avoided. 3. Develop healthy interpersonal relationships that lead to the alleviation and help prevent the relapse of depression. 4. Develop healthy thinking patterns and beliefs about self, others, and the world that lead to the alleviation and help prevent the relapse of depression. 5. Recognize, accept, and cope with feelings of depression. Diagnosis F43.21 Conditions For Discharge Achievement of treatment goals and objectives   Salomon Fick, LCSW

## 2022-09-01 ENCOUNTER — Ambulatory Visit (INDEPENDENT_AMBULATORY_CARE_PROVIDER_SITE_OTHER): Payer: BC Managed Care – PPO | Admitting: Psychology

## 2022-09-01 DIAGNOSIS — F4321 Adjustment disorder with depressed mood: Secondary | ICD-10-CM | POA: Diagnosis not present

## 2022-09-01 NOTE — Progress Notes (Signed)
[facility] Counselor/Therapist Progress Note  Patient ID: Mackenzie Khan, MRN: [id number],    Date: 09/01/2022  Time Spent: 9:00am-9:55am   55 minutes   Treatment Type: Individual Therapy  Reported Symptoms: stress  Mental Status Exam: Appearance:  Casual     Behavior: Appropriate  Motor: Normal  Speech/Language:  Normal Rate  Affect: Appropriate  Mood: normal  Thought process: normal  Thought content:   WNL  Sensory/Perceptual disturbances:   WNL  Orientation: oriented to person, place, time/date, and situation  Attention: Good  Concentration: Good  Memory: WNL  Fund of knowledge:  Good  Insight:   Good  Judgment:  Good  Impulse Control: Good   Risk Assessment: Danger to Self:  No Self-injurious Behavior: No Danger to Others: No Duty to Warn:no Physical Aggression / Violence:No  Access to Firearms a concern: No  Gang Involvement:No   Subjective: Pt present for face-to-face individual therapy via video.  Pt consents to telehealth video session due to COVID 19 pandemic. Location of pt: home Location of therapist: home office.   Pt talked about  having trouble getting motivated to do her to do list.  She feels exhausted and is still grieving the loss of her mother.    Her kids are all neuro divergent and challenging so it is exhausting for pt to deal with. Worked on [employer]ician, dispensing.   Helped pt process her feelings and addressed how hard she is on herself.   Worked on increasing self care. Provided supportive therapy.    Interventions: Cognitive Behavioral Therapy and Insight-Oriented  Diagnosis: F43.21  Plan: Plan of Care: Treatment Plan Client Abilities/Strengths  Pt is bright, engaging, and motivated for therapy.   Client Treatment Preferences  Individual therapy.  Client Statement of Needs  Improve coping skills.  Symptoms  Depressed or irritable mood. Low self-esteem. Unresolved grief issues.  Problems Addressed  Unipolar  Depression Goals 1. Alleviate depressive symptoms and return to previous level of effective functioning. 2. Appropriately grieve the loss in order to normalize mood and to return to previously adaptive level of functioning. Objective Learn and implement behavioral strategies to overcome depression. Target Date: 2023-04-12 Frequency: Biweekly  Progress: 40 Modality: individual  Related Interventions Engage the client in "behavioral activation," increasing his/her activity level and contact with sources of reward, while identifying processes that inhibit activation.  Use behavioral techniques such as instruction, rehearsal, role-playing, role reversal, as needed, to facilitate activity in the client's daily life; reinforce success. Assist the client in developing skills that increase the likelihood of deriving pleasure from behavioral activation (e.g., assertiveness skills, developing an exercise plan, less internal/more external focus, increased social involvement); reinforce success. Objective Identify important people in life, past and present, and describe the quality, good and poor, of those relationships. Target Date: 2023-04-12 Frequency: Biweekly  Progress: 40 Modality: individual  Related Interventions Conduct Interpersonal Therapy beginning with the assessment of the client's "interpersonal inventory" of important past and present relationships; develop a case formulation linking depression to grief, interpersonal role disputes, role transitions, and/or interpersonal deficits). Objective Learn and implement problem-solving and decision-making skills. Target Date: 2023-04-12 Frequency: Biweekly  Progress: 40 Modality: individual  Related Interventions Conduct Problem-Solving Therapy using techniques such as psychoeducation, modeling, and role-playing to teach client problem-solving skills (i.e., defining a problem specifically, generating possible solutions, evaluating the pros and cons  of each solution, selecting and implementing a plan of action, evaluating the efficacy of the plan, accepting or revising the plan); role-play application of the problem-solving  skill to a real life issue. Encourage in the client the development of a positive problem orientation in which problems and solving them are viewed as a natural part of life and not something to be feared, despaired, or avoided. 3. Develop healthy interpersonal relationships that lead to the alleviation and help prevent the relapse of depression. 4. Develop healthy thinking patterns and beliefs about self, others, and the world that lead to the alleviation and help prevent the relapse of depression. 5. Recognize, accept, and cope with feelings of depression. Diagnosis F43.21 Conditions For Discharge Achievement of treatment goals and objectives   Salomon Fick, LCSW

## 2022-09-16 ENCOUNTER — Ambulatory Visit: Payer: BC Managed Care – PPO | Admitting: Psychology

## 2022-09-30 ENCOUNTER — Ambulatory Visit (INDEPENDENT_AMBULATORY_CARE_PROVIDER_SITE_OTHER): Payer: BC Managed Care – PPO | Admitting: Psychology

## 2022-09-30 DIAGNOSIS — F4321 Adjustment disorder with depressed mood: Secondary | ICD-10-CM

## 2022-09-30 NOTE — Progress Notes (Signed)
[facility] Counselor/Therapist Progress Note  Patient ID: Mackenzie Khan, MRN: [id number],    Date: 09/30/2022  Time Spent: 9:00am-9:55am   55 minutes   Treatment Type: Individual Therapy  Reported Symptoms: stress  Mental Status Exam: Appearance:  Casual     Behavior: Appropriate  Motor: Normal  Speech/Language:  Normal Rate  Affect: Appropriate  Mood: normal  Thought process: normal  Thought content:   WNL  Sensory/Perceptual disturbances:   WNL  Orientation: oriented to person, place, time/date, and situation  Attention: Good  Concentration: Good  Memory: WNL  Fund of knowledge:  Good  Insight:   Good  Judgment:  Good  Impulse Control: Good   Risk Assessment: Danger to Self:  No Self-injurious Behavior: No Danger to Others: No Duty to Warn:no Physical Aggression / Violence:No  Access to Firearms a concern: No  Gang Involvement:No   Subjective: Pt present for face-to-face individual therapy via video.  Pt consents to telehealth video session due to COVID 19 pandemic. Location of pt: home Location of therapist: home office.   Pt talked about  feeling "very heavy emotionally".  She feels exhausted and is still grieving the loss of her mother.    Her kids are all neuro divergent and challenging so it is exhausting for pt to deal with.  Pt is not getting good sleep at night bc her son sleeps with her.   Worked on [employer]ician, dispensing.   Helped pt process her feelings and addressed how hard she is on herself.   Pt talked about her relationship with her husband.  She states he is autistic and does not meet her emotional needs very well.   They are in marital counseling which has been somewhat helpful.  Helped pt process the relationship dynamics.  Worked on increasing self care. Provided supportive therapy.    Interventions: Cognitive Behavioral Therapy and Insight-Oriented  Diagnosis: F43.21  Plan of Care: Recommend ongoing therapy.   Pt  participated in setting treatment goals.   Plan to meet every two weeks. Treatment Plan Client Abilities/Strengths  Pt is bright, engaging, and motivated for therapy.   Client Treatment Preferences  Individual therapy.  Client Statement of Needs  Improve coping skills.  Symptoms  Depressed or irritable mood. Low self-esteem. Unresolved grief issues.  Problems Addressed  Unipolar Depression Goals 1. Alleviate depressive symptoms and return to previous level of effective functioning. 2. Appropriately grieve the loss in order to normalize mood and to return to previously adaptive level of functioning. Objective Learn and implement behavioral strategies to overcome depression. Target Date: 2023-04-12 Frequency: Biweekly  Progress: 40 Modality: individual  Related Interventions Engage the client in "behavioral activation," increasing his/her activity level and contact with sources of reward, while identifying processes that inhibit activation.  Use behavioral techniques such as instruction, rehearsal, role-playing, role reversal, as needed, to facilitate activity in the client's daily life; reinforce success. Assist the client in developing skills that increase the likelihood of deriving pleasure from behavioral activation (e.g., assertiveness skills, developing an exercise plan, less internal/more external focus, increased social involvement); reinforce success. Objective Identify important people in life, past and present, and describe the quality, good and poor, of those relationships. Target Date: 2023-04-12 Frequency: Biweekly  Progress: 40 Modality: individual  Related Interventions Conduct Interpersonal Therapy beginning with the assessment of the client's "interpersonal inventory" of important past and present relationships; develop a case formulation linking depression to grief, interpersonal role disputes, role transitions, and/or interpersonal deficits). Objective Learn and  implement  problem-solving and decision-making skills. Target Date: 2023-04-12 Frequency: Biweekly  Progress: 40 Modality: individual  Related Interventions Conduct Problem-Solving Therapy using techniques such as psychoeducation, modeling, and role-playing to teach client problem-solving skills (i.e., defining a problem specifically, generating possible solutions, evaluating the pros and cons of each solution, selecting and implementing a plan of action, evaluating the efficacy of the plan, accepting or revising the plan); role-play application of the problem-solving skill to a real life issue. Encourage in the client the development of a positive problem orientation in which problems and solving them are viewed as a natural part of life and not something to be feared, despaired, or avoided. 3. Develop healthy interpersonal relationships that lead to the alleviation and help prevent the relapse of depression. 4. Develop healthy thinking patterns and beliefs about self, others, and the world that lead to the alleviation and help prevent the relapse of depression. 5. Recognize, accept, and cope with feelings of depression. Diagnosis F43.21 Conditions For Discharge Achievement of treatment goals and objectives   Salomon Fick, LCSW

## 2022-10-10 ENCOUNTER — Ambulatory Visit (INDEPENDENT_AMBULATORY_CARE_PROVIDER_SITE_OTHER): Payer: BC Managed Care – PPO | Admitting: Psychology

## 2022-10-10 DIAGNOSIS — F4321 Adjustment disorder with depressed mood: Secondary | ICD-10-CM | POA: Diagnosis not present

## 2022-10-10 NOTE — Progress Notes (Signed)
[facility] Counselor/Therapist Progress Note  Patient ID: Mackenzie Khan, MRN: [id number],    Date: 10/10/2022  Time Spent: 10:00am-10:55am   55 minutes   Treatment Type: Individual Therapy  Reported Symptoms: stress  Mental Status Exam: Appearance:  Casual     Behavior: Appropriate  Motor: Normal  Speech/Language:  Normal Rate  Affect: Appropriate  Mood: normal  Thought process: normal  Thought content:   WNL  Sensory/Perceptual disturbances:   WNL  Orientation: oriented to person, place, time/date, and situation  Attention: Good  Concentration: Good  Memory: WNL  Fund of knowledge:  Good  Insight:   Good  Judgment:  Good  Impulse Control: Good   Risk Assessment: Danger to Self:  No Self-injurious Behavior: No Danger to Others: No Duty to Warn:no Physical Aggression / Violence:No  Access to Firearms a concern: No  Gang Involvement:No   Subjective: Pt present for face-to-face individual therapy via video.  Pt consents to telehealth video session due to COVID 19 pandemic. Location of pt: home Location of therapist: home office.   Pt talked about not getting much sleep bc of her son sleeping with her and disrupting her sleep.  Pt talked about her weight.  She wants to lose weight but does not feel motivated.  She is at a higher weight than normal.  Pt feels badly about her weight.  She feels like she eats when she is tired and wants to eat something crunchy.   She also does emotional eating.   Addressed pt's eating patterns and what triggers her emotional eating.  Pt has had success in the past losing weight on [employer] but gained all of the weight back.   Addressed pt's relationship with food and her body image.   Helped pt process her feelings and addressed how hard she is on herself.   Worked on increasing self care. Provided supportive therapy.    Interventions: Cognitive Behavioral Therapy and Insight-Oriented  Diagnosis:  F43.21  Plan of Care: Recommend ongoing therapy.   Pt participated in setting treatment goals.   Plan to meet every two weeks. Treatment Plan Client Abilities/Strengths  Pt is bright, engaging, and motivated for therapy.   Client Treatment Preferences  Individual therapy.  Client Statement of Needs  Improve coping skills.  Symptoms  Depressed or irritable mood. Low self-esteem. Unresolved grief issues.  Problems Addressed  Unipolar Depression Goals 1. Alleviate depressive symptoms and return to previous level of effective functioning. 2. Appropriately grieve the loss in order to normalize mood and to return to previously adaptive level of functioning. Objective Learn and implement behavioral strategies to overcome depression. Target Date: 2023-04-12 Frequency: Biweekly  Progress: 40 Modality: individual  Related Interventions Engage the client in "behavioral activation," increasing his/her activity level and contact with sources of reward, while identifying processes that inhibit activation.  Use behavioral techniques such as instruction, rehearsal, role-playing, role reversal, as needed, to facilitate activity in the client's daily life; reinforce success. Assist the client in developing skills that increase the likelihood of deriving pleasure from behavioral activation (e.g., assertiveness skills, developing an exercise plan, less internal/more external focus, increased social involvement); reinforce success. Objective Identify important people in life, past and present, and describe the quality, good and poor, of those relationships. Target Date: 2023-04-12 Frequency: Biweekly  Progress: 40 Modality: individual  Related Interventions Conduct Interpersonal Therapy beginning with the assessment of the client's "interpersonal inventory" of important past and present relationships; develop a case formulation linking depression to grief, interpersonal  role disputes, role transitions,  and/or interpersonal deficits). Objective Learn and implement problem-solving and decision-making skills. Target Date: 2023-04-12 Frequency: Biweekly  Progress: 40 Modality: individual  Related Interventions Conduct Problem-Solving Therapy using techniques such as psychoeducation, modeling, and role-playing to teach client problem-solving skills (i.e., defining a problem specifically, generating possible solutions, evaluating the pros and cons of each solution, selecting and implementing a plan of action, evaluating the efficacy of the plan, accepting or revising the plan); role-play application of the problem-solving skill to a real life issue. Encourage in the client the development of a positive problem orientation in which problems and solving them are viewed as a natural part of life and not something to be feared, despaired, or avoided. 3. Develop healthy interpersonal relationships that lead to the alleviation and help prevent the relapse of depression. 4. Develop healthy thinking patterns and beliefs about self, others, and the world that lead to the alleviation and help prevent the relapse of depression. 5. Recognize, accept, and cope with feelings of depression. Diagnosis F43.21 Conditions For Discharge Achievement of treatment goals and objectives   Salomon Fick, LCSW

## 2022-10-28 ENCOUNTER — Ambulatory Visit (INDEPENDENT_AMBULATORY_CARE_PROVIDER_SITE_OTHER): Payer: BC Managed Care – PPO | Admitting: Psychology

## 2022-10-28 DIAGNOSIS — F4321 Adjustment disorder with depressed mood: Secondary | ICD-10-CM

## 2022-10-28 NOTE — Progress Notes (Signed)
[facility] Counselor/Therapist Progress Note  Patient ID: Mackenzie Khan, MRN: [id number],    Date: 10/28/2022  Time Spent: 9:00am-9:50am   50 minutes   Treatment Type: Individual Therapy  Reported Symptoms: stress  Mental Status Exam: Appearance:  Casual     Behavior: Appropriate  Motor: Normal  Speech/Language:  Normal Rate  Affect: Appropriate  Mood: normal  Thought process: normal  Thought content:   WNL  Sensory/Perceptual disturbances:   WNL  Orientation: oriented to person, place, time/date, and situation  Attention: Good  Concentration: Good  Memory: WNL  Fund of knowledge:  Good  Insight:   Good  Judgment:  Good  Impulse Control: Good   Risk Assessment: Danger to Self:  No Self-injurious Behavior: No Danger to Others: No Duty to Warn:no Physical Aggression / Violence:No  Access to Firearms a concern: No  Gang Involvement:No   Subjective: Pt present for face-to-face individual therapy via video.  Pt consents to telehealth video session and is aware of limitations of virtual sessions. Location of pt: home Location of therapist: home office.   Pt talked about taking the kids on vacation to [employer] [facility] and it was a very challenging time bc of the attitudes of her twins and husband and dealing with Mackenzie Khan's autism.   Addressed the challenges and how they impacted pt. Pt was upset that her husband was not much of a help with the kids.   Pt was overwhelmed with dealing with all the challenges on her own.    Helped pt process her feelings.   Pt has been worrying about how to handle the future care for [name] bc of the unknowns about if he will ever be able to live independently.    Addressed pt's worries and fears.    Pt talked about frustrations with her marriage.   Addressed the issues and helped pt process relationship dynamics.   Addressed how pt can effectively ask for what she needs.  Worked on increasing self care. Provided supportive therapy.     Interventions: Cognitive Behavioral Therapy and Insight-Oriented  Diagnosis: F43.21  Plan of Care: Recommend ongoing therapy.   Pt participated in setting treatment goals.   Plan to meet every two weeks. Treatment Plan Client Abilities/Strengths  Pt is bright, engaging, and motivated for therapy.   Client Treatment Preferences  Individual therapy.  Client Statement of Needs  Improve coping skills.  Symptoms  Depressed or irritable mood. Low self-esteem. Unresolved grief issues.  Problems Addressed  Unipolar Depression Goals 1. Alleviate depressive symptoms and return to previous level of effective functioning. 2. Appropriately grieve the loss in order to normalize mood and to return to previously adaptive level of functioning. Objective Learn and implement behavioral strategies to overcome depression. Target Date: 2023-04-12 Frequency: Biweekly  Progress: 40 Modality: individual  Related Interventions Engage the client in "behavioral activation," increasing his/her activity level and contact with sources of reward, while identifying processes that inhibit activation.  Use behavioral techniques such as instruction, rehearsal, role-playing, role reversal, as needed, to facilitate activity in the client's daily life; reinforce success. Assist the client in developing skills that increase the likelihood of deriving pleasure from behavioral activation (e.g., assertiveness skills, developing an exercise plan, less internal/more external focus, increased social involvement); reinforce success. Objective Identify important people in life, past and present, and describe the quality, good and poor, of those relationships. Target Date: 2023-04-12 Frequency: Biweekly  Progress: 40 Modality: individual  Related Interventions Conduct Interpersonal Therapy beginning with the assessment of the  client's "interpersonal inventory" of important past and present relationships; develop a case  formulation linking depression to grief, interpersonal role disputes, role transitions, and/or interpersonal deficits). Objective Learn and implement problem-solving and decision-making skills. Target Date: 2023-04-12 Frequency: Biweekly  Progress: 40 Modality: individual  Related Interventions Conduct Problem-Solving Therapy using techniques such as psychoeducation, modeling, and role-playing to teach client problem-solving skills (i.e., defining a problem specifically, generating possible solutions, evaluating the pros and cons of each solution, selecting and implementing a plan of action, evaluating the efficacy of the plan, accepting or revising the plan); role-play application of the problem-solving skill to a real life issue. Encourage in the client the development of a positive problem orientation in which problems and solving them are viewed as a natural part of life and not something to be feared, despaired, or avoided. 3. Develop healthy interpersonal relationships that lead to the alleviation and help prevent the relapse of depression. 4. Develop healthy thinking patterns and beliefs about self, others, and the world that lead to the alleviation and help prevent the relapse of depression. 5. Recognize, accept, and cope with feelings of depression. Diagnosis F43.21 Conditions For Discharge Achievement of treatment goals and objectives   Salomon Fick, LCSW

## 2022-11-08 ENCOUNTER — Ambulatory Visit (INDEPENDENT_AMBULATORY_CARE_PROVIDER_SITE_OTHER): Payer: BC Managed Care – PPO | Admitting: Psychology

## 2022-11-08 DIAGNOSIS — F4321 Adjustment disorder with depressed mood: Secondary | ICD-10-CM

## 2022-11-08 NOTE — Progress Notes (Signed)
**Note Mackenzie-Identified via Obfuscation** [facility] Counselor/Therapist Progress Note  Patient ID: DEMARA Khan, MRN: [id number],    Date: 11/08/2022  Time Spent: 9:00am-9:50am   50 minutes   Treatment Type: Individual Therapy  Reported Symptoms: stress  Mental Status Exam: Appearance:  Casual     Behavior: Appropriate  Motor: Normal  Speech/Language:  Normal Rate  Affect: Appropriate  Mood: normal  Thought process: normal  Thought content:   WNL  Sensory/Perceptual disturbances:   WNL  Orientation: oriented to person, place, time/date, and situation  Attention: Good  Concentration: Good  Memory: WNL  Fund of knowledge:  Good  Insight:   Good  Judgment:  Good  Impulse Control: Good   Risk Assessment: Danger to Self:  No Self-injurious Behavior: No Danger to Others: No Duty to Warn:no Physical Aggression / Violence:No  Access to Firearms a concern: No  Gang Involvement:No   Subjective: Pt present for face-to-face individual therapy via video.  Pt consents to telehealth video session and is aware of limitations of virtual sessions. Location of pt: home Location of therapist: home office.   Pt talked about challenges with her kids.   [name] has been difficult this week and pt's husband has not been patient with him and not helpful to pt.   [name] does not have friends which is worrisome to pt.   Addressed pt's worries and parenting challenges. Pt talked about her cousin's son committing suicide the same week pt's mother died.   Addressed the loss and how it impacts pt.   Helped pt process her feelings and grief.  Pt is fearful of things beyond her control and expects herself to do things perfectly.   Worked on self compassion. Worked on increasing self care. Provided supportive therapy.    Interventions: Cognitive Behavioral Therapy and Insight-Oriented  Diagnosis: F43.21  Plan of Care: Recommend ongoing therapy.   Pt participated in setting treatment goals.   Plan to meet every two  weeks. Treatment Plan Client Abilities/Strengths  Pt is bright, engaging, and motivated for therapy.   Client Treatment Preferences  Individual therapy.  Client Statement of Needs  Improve coping skills.  Symptoms  Depressed or irritable mood. Low self-esteem. Unresolved grief issues.  Problems Addressed  Unipolar Depression Goals 1. Alleviate depressive symptoms and return to previous level of effective functioning. 2. Appropriately grieve the loss in order to normalize mood and to return to previously adaptive level of functioning. Objective Learn and implement behavioral strategies to overcome depression. Target Date: 2023-04-12 Frequency: Biweekly  Progress: 40 Modality: individual  Related Interventions Engage the client in "behavioral activation," increasing his/her activity level and contact with sources of reward, while identifying processes that inhibit activation.  Use behavioral techniques such as instruction, rehearsal, role-playing, role reversal, as needed, to facilitate activity in the client's daily life; reinforce success. Assist the client in developing skills that increase the likelihood of deriving pleasure from behavioral activation (e.g., assertiveness skills, developing an exercise plan, less internal/more external focus, increased social involvement); reinforce success. Objective Identify important people in life, past and present, and describe the quality, good and poor, of those relationships. Target Date: 2023-04-12 Frequency: Biweekly  Progress: 40 Modality: individual  Related Interventions Conduct Interpersonal Therapy beginning with the assessment of the client's "interpersonal inventory" of important past and present relationships; develop a case formulation linking depression to grief, interpersonal role disputes, role transitions, and/or interpersonal deficits). Objective Learn and implement problem-solving and decision-making skills. Target Date:  2023-04-12 Frequency: Biweekly  Progress: 40 Modality: individual  Related Interventions Conduct Problem-Solving Therapy using techniques such as psychoeducation, modeling, and role-playing to teach client problem-solving skills (i.e., defining a problem specifically, generating possible solutions, evaluating the pros and cons of each solution, selecting and implementing a plan of action, evaluating the efficacy of the plan, accepting or revising the plan); role-play application of the problem-solving skill to a real life issue. Encourage in the client the development of a positive problem orientation in which problems and solving them are viewed as a natural part of life and not something to be feared, despaired, or avoided. 3. Develop healthy interpersonal relationships that lead to the alleviation and help prevent the relapse of depression. 4. Develop healthy thinking patterns and beliefs about self, others, and the world that lead to the alleviation and help prevent the relapse of depression. 5. Recognize, accept, and cope with feelings of depression. Diagnosis F43.21 Conditions For Discharge Achievement of treatment goals and objectives   Mackenzie Fick, LCSW

## 2022-11-28 ENCOUNTER — Ambulatory Visit (INDEPENDENT_AMBULATORY_CARE_PROVIDER_SITE_OTHER): Payer: BC Managed Care – PPO | Admitting: Psychology

## 2022-11-28 DIAGNOSIS — F4321 Adjustment disorder with depressed mood: Secondary | ICD-10-CM | POA: Diagnosis not present

## 2022-11-28 NOTE — Progress Notes (Signed)
[facility] Counselor/Therapist Progress Note  Patient ID: TEKISHA DARCEY, MRN: [id number],    Date: 11/28/2022  Time Spent: 11:00am-11:55am   55 minutes   Treatment Type: Individual Therapy  Reported Symptoms: stress  Mental Status Exam: Appearance:  Casual     Behavior: Appropriate  Motor: Normal  Speech/Language:  Normal Rate  Affect: Appropriate  Mood: normal  Thought process: normal  Thought content:   WNL  Sensory/Perceptual disturbances:   WNL  Orientation: oriented to person, place, time/date, and situation  Attention: Good  Concentration: Good  Memory: WNL  Fund of knowledge:  Good  Insight:   Good  Judgment:  Good  Impulse Control: Good   Risk Assessment: Danger to Self:  No Self-injurious Behavior: No Danger to Others: No Duty to Warn:no Physical Aggression / Violence:No  Access to Firearms a concern: No  Gang Involvement:No   Subjective: Pt present for face-to-face individual therapy via video.  Pt consents to telehealth video session and is aware of limitations of virtual sessions. Location of pt: home Location of therapist: home office.   Pt talked about going out of town with her husband for a weekend and how she had to intervene with kid issues during their time away.  Addressed how frustrating this was. Pt saw her psychiatrist today and her medications were adjusted to help give pt more energy.   Pt talked about her fears about [name]'s welfare and that he can't recognize danger.   She gets into fear ruminations.  Worked on how pt can handle those ruminations.  Worked on thought reframing.   Pt is fearful of things beyond her control and expects herself to do things perfectly.   Worked on self compassion. Worked on increasing self care. Provided supportive therapy.    Interventions: Cognitive Behavioral Therapy and Insight-Oriented  Diagnosis: F43.21  Plan of Care: Recommend ongoing therapy.   Pt participated in setting  treatment goals.   Plan to meet every two weeks. Treatment Plan Client Abilities/Strengths  Pt is bright, engaging, and motivated for therapy.   Client Treatment Preferences  Individual therapy.  Client Statement of Needs  Improve coping skills.  Symptoms  Depressed or irritable mood. Low self-esteem. Unresolved grief issues.  Problems Addressed  Unipolar Depression Goals 1. Alleviate depressive symptoms and return to previous level of effective functioning. 2. Appropriately grieve the loss in order to normalize mood and to return to previously adaptive level of functioning. Objective Learn and implement behavioral strategies to overcome depression. Target Date: 2023-04-12 Frequency: Biweekly  Progress: 40 Modality: individual  Related Interventions Engage the client in "behavioral activation," increasing his/her activity level and contact with sources of reward, while identifying processes that inhibit activation.  Use behavioral techniques such as instruction, rehearsal, role-playing, role reversal, as needed, to facilitate activity in the client's daily life; reinforce success. Assist the client in developing skills that increase the likelihood of deriving pleasure from behavioral activation (e.g., assertiveness skills, developing an exercise plan, less internal/more external focus, increased social involvement); reinforce success. Objective Identify important people in life, past and present, and describe the quality, good and poor, of those relationships. Target Date: 2023-04-12 Frequency: Biweekly  Progress: 40 Modality: individual  Related Interventions Conduct Interpersonal Therapy beginning with the assessment of the client's "interpersonal inventory" of important past and present relationships; develop a case formulation linking depression to grief, interpersonal role disputes, role transitions, and/or interpersonal deficits). Objective Learn and implement problem-solving  and decision-making skills. Target Date: 2023-04-12 Frequency: Biweekly  Progress:  40 Modality: individual  Related Interventions Conduct Problem-Solving Therapy using techniques such as psychoeducation, modeling, and role-playing to teach client problem-solving skills (i.e., defining a problem specifically, generating possible solutions, evaluating the pros and cons of each solution, selecting and implementing a plan of action, evaluating the efficacy of the plan, accepting or revising the plan); role-play application of the problem-solving skill to a real life issue. Encourage in the client the development of a positive problem orientation in which problems and solving them are viewed as a natural part of life and not something to be feared, despaired, or avoided. 3. Develop healthy interpersonal relationships that lead to the alleviation and help prevent the relapse of depression. 4. Develop healthy thinking patterns and beliefs about self, others, and the world that lead to the alleviation and help prevent the relapse of depression. 5. Recognize, accept, and cope with feelings of depression. Diagnosis F43.21 Conditions For Discharge Achievement of treatment goals and objectives   Salomon Fick, LCSW

## 2022-12-16 ENCOUNTER — Ambulatory Visit (INDEPENDENT_AMBULATORY_CARE_PROVIDER_SITE_OTHER): Payer: BC Managed Care – PPO | Admitting: Psychology

## 2022-12-16 DIAGNOSIS — F4321 Adjustment disorder with depressed mood: Secondary | ICD-10-CM | POA: Diagnosis not present

## 2022-12-16 NOTE — Progress Notes (Signed)
[facility] Counselor/Therapist Progress Note  Patient ID: Mackenzie Khan, MRN: [id number],    Date: 12/16/2022  Time Spent: 9:00am-9:55am   55 minutes   Treatment Type: Individual Therapy  Reported Symptoms: stress  Mental Status Exam: Appearance:  Casual     Behavior: Appropriate  Motor: Normal  Speech/Language:  Normal Rate  Affect: Appropriate  Mood: normal  Thought process: normal  Thought content:   WNL  Sensory/Perceptual disturbances:   WNL  Orientation: oriented to person, place, time/date, and situation  Attention: Good  Concentration: Good  Memory: WNL  Fund of knowledge:  Good  Insight:   Good  Judgment:  Good  Impulse Control: Good   Risk Assessment: Danger to Self:  No Self-injurious Behavior: No Danger to Others: No Duty to Warn:no Physical Aggression / Violence:No  Access to Firearms a concern: No  Gang Involvement:No   Subjective: Pt present for face-to-face individual therapy via video.  Pt consents to telehealth video session and is aware of limitations of virtual sessions. Location of pt: home Location of therapist: home office.   Pt talked about going to the beach last week with family.  Pt states it was a "roller coaster" bc of the kids' behavior.  [name] was especially difficult.  [name] father and his girlfriend joined them for some of the time.   Pt felt ok with the girlfriend being there except she talked a lot.  Addressed the family dynamics.  Addressed pt's disappointments about vacation.   Worked on how pt can release unrealistic expectations for her family and focus on things she can do to improve her sense of well being.  Pt is fearful of things beyond her control and expects herself to do things perfectly.   Worked on self compassion. Worked on increasing self care. Provided supportive therapy.    Interventions: Cognitive Behavioral Therapy and Insight-Oriented  Diagnosis: F43.21  Plan of Care: Recommend ongoing  therapy.   Pt participated in setting treatment goals.   Plan to meet every two weeks. Treatment Plan Client Abilities/Strengths  Pt is bright, engaging, and motivated for therapy.   Client Treatment Preferences  Individual therapy.  Client Statement of Needs  Improve coping skills.  Symptoms  Depressed or irritable mood. Low self-esteem. Unresolved grief issues.  Problems Addressed  Unipolar Depression Goals 1. Alleviate depressive symptoms and return to previous level of effective functioning. 2. Appropriately grieve the loss in order to normalize mood and to return to previously adaptive level of functioning. Objective Learn and implement behavioral strategies to overcome depression. Target Date: 2023-04-12 Frequency: Biweekly  Progress: 40 Modality: individual  Related Interventions Engage the client in "behavioral activation," increasing his/her activity level and contact with sources of reward, while identifying processes that inhibit activation.  Use behavioral techniques such as instruction, rehearsal, role-playing, role reversal, as needed, to facilitate activity in the client's daily life; reinforce success. Assist the client in developing skills that increase the likelihood of deriving pleasure from behavioral activation (e.g., assertiveness skills, developing an exercise plan, less internal/more external focus, increased social involvement); reinforce success. Objective Identify important people in life, past and present, and describe the quality, good and poor, of those relationships. Target Date: 2023-04-12 Frequency: Biweekly  Progress: 40 Modality: individual  Related Interventions Conduct Interpersonal Therapy beginning with the assessment of the client's "interpersonal inventory" of important past and present relationships; develop a case formulation linking depression to grief, interpersonal role disputes, role transitions, and/or interpersonal  deficits). Objective Learn and implement problem-solving and  decision-making skills. Target Date: 2023-04-12 Frequency: Biweekly  Progress: 40 Modality: individual  Related Interventions Conduct Problem-Solving Therapy using techniques such as psychoeducation, modeling, and role-playing to teach client problem-solving skills (i.e., defining a problem specifically, generating possible solutions, evaluating the pros and cons of each solution, selecting and implementing a plan of action, evaluating the efficacy of the plan, accepting or revising the plan); role-play application of the problem-solving skill to a real life issue. Encourage in the client the development of a positive problem orientation in which problems and solving them are viewed as a natural part of life and not something to be feared, despaired, or avoided. 3. Develop healthy interpersonal relationships that lead to the alleviation and help prevent the relapse of depression. 4. Develop healthy thinking patterns and beliefs about self, others, and the world that lead to the alleviation and help prevent the relapse of depression. 5. Recognize, accept, and cope with feelings of depression. Diagnosis F43.21 Conditions For Discharge Achievement of treatment goals and objectives   Salomon Fick, LCSW

## 2023-01-03 ENCOUNTER — Ambulatory Visit: Payer: BC Managed Care – PPO | Admitting: Psychology

## 2023-01-17 ENCOUNTER — Ambulatory Visit (INDEPENDENT_AMBULATORY_CARE_PROVIDER_SITE_OTHER): Payer: BC Managed Care – PPO | Admitting: Psychology

## 2023-01-17 DIAGNOSIS — F4321 Adjustment disorder with depressed mood: Secondary | ICD-10-CM

## 2023-01-17 NOTE — Progress Notes (Signed)
[facility] Counselor/Therapist Progress Note  Patient ID: Mackenzie Khan, MRN: [id number],    Date: 01/17/2023  Time Spent: 9:00am-9:55am   55 minutes   Treatment Type: Individual Therapy  Reported Symptoms: stress  Mental Status Exam: Appearance:  Casual     Behavior: Appropriate  Motor: Normal  Speech/Language:  Normal Rate  Affect: Appropriate  Mood: normal  Thought process: normal  Thought content:   WNL  Sensory/Perceptual disturbances:   WNL  Orientation: oriented to person, place, time/date, and situation  Attention: Good  Concentration: Good  Memory: WNL  Fund of knowledge:  Good  Insight:   Good  Judgment:  Good  Impulse Control: Good   Risk Assessment: Danger to Self:  No Self-injurious Behavior: No Danger to Others: No Duty to Warn:no Physical Aggression / Violence:No  Access to Firearms a concern: No  Gang Involvement:No   Subjective: Pt present for face-to-face individual therapy via video.  Pt consents to telehealth video session and is aware of limitations and benefits of virtual sessions. Location of pt: home Location of therapist: home office.   Pt talked about her kids starting back to school.   Her son Mackenzie Khan has had a rough start at [employer].  Addressed the issues.  Mackenzie Khan has had some behavioral problems at school.  Pt thinks it is related to Mackenzie Khan missing his friends at his previous school.   [name]'s behavior has been so disruptive at school that the principal is threatening suspension and possibly removal from the school.   Pt has been very upset about this.  Helped pt process her feelings and worked on parenting strategies.  Pt talked about planning a trip with her daughter in October where they need to fly.  Pt feels very anxious about flying.  She has irrational fear thoughts about flying.  Worked on thought reframing and calming strategies. Worked on increasing self care. Provided supportive therapy.     Interventions: Cognitive Behavioral Therapy and Insight-Oriented  Diagnosis: F43.21  Plan of Care: Recommend ongoing therapy.   Pt participated in setting treatment goals.   Plan to meet every two weeks. Treatment Plan Client Abilities/Strengths  Pt is bright, engaging, and motivated for therapy.   Client Treatment Preferences  Individual therapy.  Client Statement of Needs  Improve coping skills.  Symptoms  Depressed or irritable mood. Low self-esteem. Unresolved grief issues.  Problems Addressed  Unipolar Depression Goals 1. Alleviate depressive symptoms and return to previous level of effective functioning. 2. Appropriately grieve the loss in order to normalize mood and to return to previously adaptive level of functioning. Objective Learn and implement behavioral strategies to overcome depression. Target Date: 2023-04-12 Frequency: Biweekly  Progress: 40 Modality: individual  Related Interventions Engage the client in "behavioral activation," increasing his/her activity level and contact with sources of reward, while identifying processes that inhibit activation.  Use behavioral techniques such as instruction, rehearsal, role-playing, role reversal, as needed, to facilitate activity in the client's daily life; reinforce success. Assist the client in developing skills that increase the likelihood of deriving pleasure from behavioral activation (e.g., assertiveness skills, developing an exercise plan, less internal/more external focus, increased social involvement); reinforce success. Objective Identify important people in life, past and present, and describe the quality, good and poor, of those relationships. Target Date: 2023-04-12 Frequency: Biweekly  Progress: 40 Modality: individual  Related Interventions Conduct Interpersonal Therapy beginning with the assessment of the client's "interpersonal inventory" of important past and present relationships; develop a case  formulation linking  depression to grief, interpersonal role disputes, role transitions, and/or interpersonal deficits). Objective Learn and implement problem-solving and decision-making skills. Target Date: 2023-04-12 Frequency: Biweekly  Progress: 40 Modality: individual  Related Interventions Conduct Problem-Solving Therapy using techniques such as psychoeducation, modeling, and role-playing to teach client problem-solving skills (i.e., defining a problem specifically, generating possible solutions, evaluating the pros and cons of each solution, selecting and implementing a plan of action, evaluating the efficacy of the plan, accepting or revising the plan); role-play application of the problem-solving skill to a real life issue. Encourage in the client the development of a positive problem orientation in which problems and solving them are viewed as a natural part of life and not something to be feared, despaired, or avoided. 3. Develop healthy interpersonal relationships that lead to the alleviation and help prevent the relapse of depression. 4. Develop healthy thinking patterns and beliefs about self, others, and the world that lead to the alleviation and help prevent the relapse of depression. 5. Recognize, accept, and cope with feelings of depression. Diagnosis F43.21 Conditions For Discharge Achievement of treatment goals and objectives   Salomon Fick, LCSW

## 2023-01-31 ENCOUNTER — Ambulatory Visit (INDEPENDENT_AMBULATORY_CARE_PROVIDER_SITE_OTHER): Payer: BC Managed Care – PPO | Admitting: Psychology

## 2023-01-31 DIAGNOSIS — F4321 Adjustment disorder with depressed mood: Secondary | ICD-10-CM

## 2023-01-31 NOTE — Progress Notes (Signed)
[facility] Counselor/Therapist Progress Note  Patient ID: Mackenzie Khan, MRN: [id number],    Date: 01/31/2023  Time Spent: 9:00am-9:55am   55 minutes   Treatment Type: Individual Therapy  Reported Symptoms: stress  Mental Status Exam: Appearance:  Casual     Behavior: Appropriate  Motor: Normal  Speech/Language:  Normal Rate  Affect: Appropriate  Mood: normal  Thought process: normal  Thought content:   WNL  Sensory/Perceptual disturbances:   WNL  Orientation: oriented to person, place, time/date, and situation  Attention: Good  Concentration: Good  Memory: WNL  Fund of knowledge:  Good  Insight:   Good  Judgment:  Good  Impulse Control: Good   Risk Assessment: Danger to Self:  No Self-injurious Behavior: No Danger to Others: No Duty to Warn:no Physical Aggression / Violence:No  Access to Firearms a concern: No  Gang Involvement:No   Subjective: Pt present for face-to-face individual therapy via video.  Pt consents to telehealth video session and is aware of limitations and benefits of virtual sessions. Location of pt: home Location of therapist: home office.   Pt talked about being sick with a sinus infection.    Pt talked about her father getting married October 26th.   Pt has some feelings about it bc her mother passed away less than a year ago.  It is hard for pt to see her father's bride so happy when pt is still grieving her mother's death.  [name] father is getting rid of pt's mother's things and giving some of it to pt.   This is hard for pt.  Helped pt process her feelings and relationship dynamics.  Helped pt process her grief.  Pt talked about her son [name] and his behavioral challenges at school.   Worked on increasing self care. Provided supportive therapy.    Interventions: Cognitive Behavioral Therapy and Insight-Oriented  Diagnosis: F43.21  Plan of Care: Recommend ongoing therapy.   Pt participated in setting treatment goals.    Plan to meet every two weeks.  Pt agrees with treatment plan.  Treatment Plan Client Abilities/Strengths  Pt is bright, engaging, and motivated for therapy.   Client Treatment Preferences  Individual therapy.  Client Statement of Needs  Improve coping skills.  Symptoms  Depressed or irritable mood. Low self-esteem. Unresolved grief issues.  Problems Addressed  Unipolar Depression Goals 1. Alleviate depressive symptoms and return to previous level of effective functioning. 2. Appropriately grieve the loss in order to normalize mood and to return to previously adaptive level of functioning. Objective Learn and implement behavioral strategies to overcome depression. Target Date: 2023-04-12 Frequency: Biweekly  Progress: 40 Modality: individual  Related Interventions Engage the client in "behavioral activation," increasing his/her activity level and contact with sources of reward, while identifying processes that inhibit activation.  Use behavioral techniques such as instruction, rehearsal, role-playing, role reversal, as needed, to facilitate activity in the client's daily life; reinforce success. Assist the client in developing skills that increase the likelihood of deriving pleasure from behavioral activation (e.g., assertiveness skills, developing an exercise plan, less internal/more external focus, increased social involvement); reinforce success. Objective Identify important people in life, past and present, and describe the quality, good and poor, of those relationships. Target Date: 2023-04-12 Frequency: Biweekly  Progress: 40 Modality: individual  Related Interventions Conduct Interpersonal Therapy beginning with the assessment of the client's "interpersonal inventory" of important past and present relationships; develop a case formulation linking depression to grief, interpersonal role disputes, role transitions, and/or interpersonal deficits). Objective  Learn and implement  problem-solving and decision-making skills. Target Date: 2023-04-12 Frequency: Biweekly  Progress: 40 Modality: individual  Related Interventions Conduct Problem-Solving Therapy using techniques such as psychoeducation, modeling, and role-playing to teach client problem-solving skills (i.e., defining a problem specifically, generating possible solutions, evaluating the pros and cons of each solution, selecting and implementing a plan of action, evaluating the efficacy of the plan, accepting or revising the plan); role-play application of the problem-solving skill to a real life issue. Encourage in the client the development of a positive problem orientation in which problems and solving them are viewed as a natural part of life and not something to be feared, despaired, or avoided. 3. Develop healthy interpersonal relationships that lead to the alleviation and help prevent the relapse of depression. 4. Develop healthy thinking patterns and beliefs about self, others, and the world that lead to the alleviation and help prevent the relapse of depression. 5. Recognize, accept, and cope with feelings of depression. Diagnosis F43.21 Conditions For Discharge Achievement of treatment goals and objectives   Salomon Fick, LCSW

## 2023-02-14 ENCOUNTER — Ambulatory Visit (INDEPENDENT_AMBULATORY_CARE_PROVIDER_SITE_OTHER): Payer: BC Managed Care – PPO | Admitting: Psychology

## 2023-02-14 DIAGNOSIS — F4321 Adjustment disorder with depressed mood: Secondary | ICD-10-CM | POA: Diagnosis not present

## 2023-02-14 NOTE — Progress Notes (Signed)
[facility] Counselor/Therapist Progress Note  Patient ID: Mackenzie Khan, MRN: [id number],    Date: 02/14/2023  Time Spent: 11:00am-11:55am   55 minutes   Treatment Type: Individual Therapy  Reported Symptoms: stress  Mental Status Exam: Appearance:  Casual     Behavior: Appropriate  Motor: Normal  Speech/Language:  Normal Rate  Affect: Appropriate  Mood: normal  Thought process: normal  Thought content:   WNL  Sensory/Perceptual disturbances:   WNL  Orientation: oriented to person, place, time/date, and situation  Attention: Good  Concentration: Good  Memory: WNL  Fund of knowledge:  Good  Insight:   Good  Judgment:  Good  Impulse Control: Good   Risk Assessment: Danger to Self:  No Self-injurious Behavior: No Danger to Others: No Duty to Warn:no Physical Aggression / Violence:No  Access to Firearms a concern: No  Gang Involvement:No   Subjective: Pt present for face-to-face individual therapy via video.  Pt consents to telehealth video session and is aware of limitations and benefits of virtual sessions. Location of pt: home Location of therapist: home office.   Pt talked about flying to [place] with her daughter Mackenzie Khan.   The anticipatory anxiety was hard but she was fine once on the plane.  The time in [place] was good.  Pt had a special weekend with Mackenzie Khan which was a much needed experience for their relationship.   Pt talked about her son Mackenzie Khan.  He had a major meltdown at school last week.   Pt talked about her father getting married the end of this month.  Pt has had to attend the bridal shower and it was uncomfortable for her.  Pt is sad that the relationship with her father is changing bc of his upcoming marriage to Mackenzie Khan.   Pt is still grieving the loss of her mother so all of this is difficult to navigate.  Helped pt process her feelings and relationship dynamics.  Helped pt process her grief.  Worked on increasing self care. Provided  supportive therapy.    Interventions: Cognitive Behavioral Therapy and Insight-Oriented  Diagnosis: F43.21  Plan of Care: Recommend ongoing therapy.   Pt participated in setting treatment goals.   Plan to meet every two weeks.  Pt agrees with treatment plan.  Treatment Plan Client Abilities/Strengths  Pt is bright, engaging, and motivated for therapy.   Client Treatment Preferences  Individual therapy.  Client Statement of Needs  Improve coping skills.  Symptoms  Depressed or irritable mood. Low self-esteem. Unresolved grief issues.  Problems Addressed  Unipolar Depression Goals 1. Alleviate depressive symptoms and return to previous level of effective functioning. 2. Appropriately grieve the loss in order to normalize mood and to return to previously adaptive level of functioning. Objective Learn and implement behavioral strategies to overcome depression. Target Date: 2023-04-12 Frequency: Biweekly  Progress: 40 Modality: individual  Related Interventions Engage the client in "behavioral activation," increasing his/her activity level and contact with sources of reward, while identifying processes that inhibit activation.  Use behavioral techniques such as instruction, rehearsal, role-playing, role reversal, as needed, to facilitate activity in the client's daily life; reinforce success. Assist the client in developing skills that increase the likelihood of deriving pleasure from behavioral activation (e.g., assertiveness skills, developing an exercise plan, less internal/more external focus, increased social involvement); reinforce success. Objective Identify important people in life, past and present, and describe the quality, good and poor, of those relationships. Target Date: 2023-04-12 Frequency: Biweekly  Progress: 40 Modality: individual  Related  Interventions Conduct Interpersonal Therapy beginning with the assessment of the client's "interpersonal inventory" of important  past and present relationships; develop a case formulation linking depression to grief, interpersonal role disputes, role transitions, and/or interpersonal deficits). Objective Learn and implement problem-solving and decision-making skills. Target Date: 2023-04-12 Frequency: Biweekly  Progress: 40 Modality: individual  Related Interventions Conduct Problem-Solving Therapy using techniques such as psychoeducation, modeling, and role-playing to teach client problem-solving skills (i.e., defining a problem specifically, generating possible solutions, evaluating the pros and cons of each solution, selecting and implementing a plan of action, evaluating the efficacy of the plan, accepting or revising the plan); role-play application of the problem-solving skill to a real life issue. Encourage in the client the development of a positive problem orientation in which problems and solving them are viewed as a natural part of life and not something to be feared, despaired, or avoided. 3. Develop healthy interpersonal relationships that lead to the alleviation and help prevent the relapse of depression. 4. Develop healthy thinking patterns and beliefs about self, others, and the world that lead to the alleviation and help prevent the relapse of depression. 5. Recognize, accept, and cope with feelings of depression. Diagnosis F43.21 Conditions For Discharge Achievement of treatment goals and objectives   Salomon Fick, LCSW

## 2023-02-28 ENCOUNTER — Ambulatory Visit (INDEPENDENT_AMBULATORY_CARE_PROVIDER_SITE_OTHER): Payer: BC Managed Care – PPO | Admitting: Psychology

## 2023-02-28 DIAGNOSIS — F4321 Adjustment disorder with depressed mood: Secondary | ICD-10-CM

## 2023-02-28 NOTE — Progress Notes (Signed)
[facility] Counselor/Therapist Progress Note  Patient ID: BRANDILEE HANKO, MRN: [id number],    Date: 02/28/2023  Time Spent: 9:00am-9:55am   55 minutes   Treatment Type: Individual Therapy  Reported Symptoms: stress  Mental Status Exam: Appearance:  Casual     Behavior: Appropriate  Motor: Normal  Speech/Language:  Normal Rate  Affect: Appropriate  Mood: normal  Thought process: normal  Thought content:   WNL  Sensory/Perceptual disturbances:   WNL  Orientation: oriented to person, place, time/date, and situation  Attention: Good  Concentration: Good  Memory: WNL  Fund of knowledge:  Good  Insight:   Good  Judgment:  Good  Impulse Control: Good   Risk Assessment: Danger to Self:  No Self-injurious Behavior: No Danger to Others: No Duty to Warn:no Physical Aggression / Violence:No  Access to Firearms a concern: No  Gang Involvement:No   Subjective: Pt present for face-to-face individual therapy via video.  Pt consents to telehealth video session and is aware of limitations and benefits of virtual sessions. Location of pt: home Location of therapist: home office.   Pt talked about feeling tired today.   She did not sleep well last night.   Pt talked about her father getting married this weekend.    Pt is sad that the relationship with her father is changing bc of his upcoming marriage to Poydras.   Pt is still grieving the loss of her mother so all of this is difficult to navigate.   Pt wrote an email to her father expressing her feelings.  She has not received a response from him yet.  Helped pt process her feelings and relationship dynamics.  Helped pt process her grief.  Pt talked about her son [name].   He was not allowed to go on a field trip with his school.  Pt had to plan other events for him which was stressful.  Pt talked about her marriage.  She loves her husband but feels like she "married the wrong person".   They have grown apart.   Helped pt process her feelings and relationship dynamics.  Worked on increasing self care. Provided supportive therapy.    Interventions: Cognitive Behavioral Therapy and Insight-Oriented  Diagnosis: F43.21  Plan of Care: Recommend ongoing therapy.   Pt participated in setting treatment goals.   Plan to meet every two weeks.  Pt agrees with treatment plan.  Treatment Plan Client Abilities/Strengths  Pt is bright, engaging, and motivated for therapy.   Client Treatment Preferences  Individual therapy.  Client Statement of Needs  Improve coping skills.  Symptoms  Depressed or irritable mood. Low self-esteem. Unresolved grief issues.  Problems Addressed  Unipolar Depression Goals 1. Alleviate depressive symptoms and return to previous level of effective functioning. 2. Appropriately grieve the loss in order to normalize mood and to return to previously adaptive level of functioning. Objective Learn and implement behavioral strategies to overcome depression. Target Date: 2023-04-12 Frequency: Biweekly  Progress: 40 Modality: individual  Related Interventions Engage the client in "behavioral activation," increasing his/her activity level and contact with sources of reward, while identifying processes that inhibit activation.  Use behavioral techniques such as instruction, rehearsal, role-playing, role reversal, as needed, to facilitate activity in the client's daily life; reinforce success. Assist the client in developing skills that increase the likelihood of deriving pleasure from behavioral activation (e.g., assertiveness skills, developing an exercise plan, less internal/more external focus, increased social involvement); reinforce success. Objective Identify important people in life, past and present,  and describe the quality, good and poor, of those relationships. Target Date: 2023-04-12 Frequency: Biweekly  Progress: 40 Modality: individual  Related Interventions Conduct  Interpersonal Therapy beginning with the assessment of the client's "interpersonal inventory" of important past and present relationships; develop a case formulation linking depression to grief, interpersonal role disputes, role transitions, and/or interpersonal deficits). Objective Learn and implement problem-solving and decision-making skills. Target Date: 2023-04-12 Frequency: Biweekly  Progress: 40 Modality: individual  Related Interventions Conduct Problem-Solving Therapy using techniques such as psychoeducation, modeling, and role-playing to teach client problem-solving skills (i.e., defining a problem specifically, generating possible solutions, evaluating the pros and cons of each solution, selecting and implementing a plan of action, evaluating the efficacy of the plan, accepting or revising the plan); role-play application of the problem-solving skill to a real life issue. Encourage in the client the development of a positive problem orientation in which problems and solving them are viewed as a natural part of life and not something to be feared, despaired, or avoided. 3. Develop healthy interpersonal relationships that lead to the alleviation and help prevent the relapse of depression. 4. Develop healthy thinking patterns and beliefs about self, others, and the world that lead to the alleviation and help prevent the relapse of depression. 5. Recognize, accept, and cope with feelings of depression. Diagnosis F43.21 Conditions For Discharge Achievement of treatment goals and objectives   Salomon Fick, LCSW

## 2023-03-16 ENCOUNTER — Ambulatory Visit: Payer: BC Managed Care – PPO | Admitting: Psychology

## 2023-03-16 NOTE — Progress Notes (Deleted)
° ° ° ° ° ° ° ° ° ° ° ° ° ° °  Trinidy Masterson, LCSW

## 2023-03-31 ENCOUNTER — Ambulatory Visit (INDEPENDENT_AMBULATORY_CARE_PROVIDER_SITE_OTHER): Payer: BC Managed Care – PPO | Admitting: Psychology

## 2023-03-31 DIAGNOSIS — F4321 Adjustment disorder with depressed mood: Secondary | ICD-10-CM

## 2023-03-31 NOTE — Progress Notes (Signed)
[facility] Counselor/Therapist Progress Note  Patient ID: Mackenzie Khan, MRN: [id number],    Date: 03/31/2023  Time Spent: 9:00am-9:55am   55 minutes   Treatment Type: Individual Therapy  Reported Symptoms: stress  Mental Status Exam: Appearance:  Casual     Behavior: Appropriate  Motor: Normal  Speech/Language:  Normal Rate  Affect: Appropriate  Mood: normal  Thought process: normal  Thought content:   WNL  Sensory/Perceptual disturbances:   WNL  Orientation: oriented to person, place, time/date, and situation  Attention: Good  Concentration: Good  Memory: WNL  Fund of knowledge:  Good  Insight:   Good  Judgment:  Good  Impulse Control: Good   Risk Assessment: Danger to Self:  No Self-injurious Behavior: No Danger to Others: No Duty to Warn:no Physical Aggression / Violence:No  Access to Firearms a concern: No  Gang Involvement:No   Subjective: Pt present for face-to-face individual therapy via video.  Pt consents to telehealth video session and is aware of limitations and benefits of virtual sessions. Location of pt: home Location of therapist: home office.   Pt talked about her father getting married.  The wedding was a difficult experience for pt.   Pt is still grieving the loss of her mother so all of this is difficult to navigate.    Pt feels like the relationship with her father has changed.  Helped pt process her feelings and relationship dynamics.  Helped pt process her grief.  Pt talked about her kids.  Her son had to have surgery and her other two kids got sick with a virus.   This was very stressful for pt.  Worked on increasing self care. Provided supportive therapy.    Interventions: Cognitive Behavioral Therapy and Insight-Oriented  Diagnosis: F43.21  Plan of Care: Recommend ongoing therapy.   Pt participated in setting treatment goals.   Plan to meet every two weeks.  Pt agrees with treatment plan.  Treatment Plan Client  Abilities/Strengths  Pt is bright, engaging, and motivated for therapy.   Client Treatment Preferences  Individual therapy.  Client Statement of Needs  Improve coping skills.  Symptoms  Depressed or irritable mood. Low self-esteem. Unresolved grief issues.  Problems Addressed  Unipolar Depression Goals 1. Alleviate depressive symptoms and return to previous level of effective functioning. 2. Appropriately grieve the loss in order to normalize mood and to return to previously adaptive level of functioning. Objective Learn and implement behavioral strategies to overcome depression. Target Date: 2023-04-12 Frequency: Biweekly  Progress: 40 Modality: individual  Related Interventions Engage the client in "behavioral activation," increasing his/her activity level and contact with sources of reward, while identifying processes that inhibit activation.  Use behavioral techniques such as instruction, rehearsal, role-playing, role reversal, as needed, to facilitate activity in the client's daily life; reinforce success. Assist the client in developing skills that increase the likelihood of deriving pleasure from behavioral activation (e.g., assertiveness skills, developing an exercise plan, less internal/more external focus, increased social involvement); reinforce success. Objective Identify important people in life, past and present, and describe the quality, good and poor, of those relationships. Target Date: 2023-04-12 Frequency: Biweekly  Progress: 40 Modality: individual  Related Interventions Conduct Interpersonal Therapy beginning with the assessment of the client's "interpersonal inventory" of important past and present relationships; develop a case formulation linking depression to grief, interpersonal role disputes, role transitions, and/or interpersonal deficits). Objective Learn and implement problem-solving and decision-making skills. Target Date: 2023-04-12 Frequency: Biweekly   Progress: 40 Modality: individual  Related Interventions Conduct Problem-Solving Therapy using techniques such as psychoeducation, modeling, and role-playing to teach client problem-solving skills (i.e., defining a problem specifically, generating possible solutions, evaluating the pros and cons of each solution, selecting and implementing a plan of action, evaluating the efficacy of the plan, accepting or revising the plan); role-play application of the problem-solving skill to a real life issue. Encourage in the client the development of a positive problem orientation in which problems and solving them are viewed as a natural part of life and not something to be feared, despaired, or avoided. 3. Develop healthy interpersonal relationships that lead to the alleviation and help prevent the relapse of depression. 4. Develop healthy thinking patterns and beliefs about self, others, and the world that lead to the alleviation and help prevent the relapse of depression. 5. Recognize, accept, and cope with feelings of depression. Diagnosis F43.21 Conditions For Discharge Achievement of treatment goals and objectives   Salomon Fick, LCSW

## 2023-04-13 ENCOUNTER — Ambulatory Visit: Payer: BC Managed Care – PPO | Admitting: Psychology

## 2023-04-13 DIAGNOSIS — F4321 Adjustment disorder with depressed mood: Secondary | ICD-10-CM

## 2023-04-13 NOTE — Progress Notes (Signed)
[facility] Counselor Initial Adult Exam  Name: Mackenzie Khan Date: 04/13/2023 MRN: [id number] DOB: 08/27/1975 PCP: Dani Gobble, PA-C  Time spent: 10:00am-10:55am    55 minutes  Guardian/Payee:  Donnamarie Poag requested: No   Reason for Visit /Presenting Problem: Pt present for face-to-face initial assessment update via video.  Pt consents to telehealth video session and is aware of limitations and benefits of virtual sessions.  Location of pt: home Location of therapist: home office.  Pt continues to need therapy to address family issues and work on Pharmacologist.  [name] mother passed away a few months ago.   [name] father recently remarried which is difficult for pt.  Pt has a lot of stress parenting bc her children are neurodivergent.    Pt also feels like she and her husband Arlys John have grown apart.  Worked on communication issues and relationship dynamics.   Reviewed pt's treatment plan for annual update.  Updated pt's treatment plan and IA.    Pt participated in setting treatment goals.   Plan to continue to meet every two weeks.    Mental Status Exam: Appearance:   Casual     Behavior:  Appropriate  Motor:  Normal  Speech/Language:   Normal Rate  Affect:  Appropriate  Mood:  normal  Thought process:  normal  Thought content:    WNL  Sensory/Perceptual disturbances:    WNL  Orientation:  oriented to person, place, time/date, and situation  Attention:  Good  Concentration:  Good  Memory:  WNL  Fund of knowledge:   Good  Insight:    Good  Judgment:   Good  Impulse Control:  Good    Reported Symptoms:  sadness  Risk Assessment: Danger to Self:  No Self-injurious Behavior: No Danger to Others: No Duty to Warn:no Physical Aggression / Violence:No  Access to Firearms a concern: No  Gang Involvement:No  Patient / guardian was educated about steps to take if suicide or homicide risk level increases between visits: n/a While future  psychiatric events cannot be accurately predicted, the patient does not currently require acute inpatient psychiatric care and does not currently meet [facility] involuntary commitment criteria.  Substance Abuse History: Current substance abuse: No     Past Psychiatric History:   Previous psychological history is significant for depression Outpatient Providers:pt was in therapy with Colen Darling until [place] retired. History of Psych Hospitalization: No  Psychological Testing:  n/a    Abuse History:  Victim of: No.,  n/a    Report needed: No. Victim of Neglect:No. Perpetrator of  n/a   Witness / Exposure to Domestic Violence: No   Protective Services Involvement: No  Witness to [employer] Violence:  No   Family History: No family history on file.  Living situation: the patient lives with their family. She lives with her husband and 3 children ages 40 yo twins and 75 yo son.    Pt grew up with both parents and a brother 5 years older.   Brother has history of substance abuse.   [name] mother has dementia.   Pt has a positive family history for mental illness and substance abuse.   Sexual Orientation: Straight  Relationship Status: married  Name of spouse / other:Brian If a parent, number of children / ages:pt has 3 children ages 70 yo twins and 65.  Support Systems: spouse parents  Financial Stress:  No   Income/Employment/Disability: Employment  Financial planner: No   Educational History:  Education: college graduate  Religion/Sprituality/World View: Protestant  Any cultural differences that may affect / interfere with treatment:  not applicable   Recreation/Hobbies: reading  Stressors: Marital or family conflict    Strengths: Supportive Relationships, Hopefulness, Self Advocate, and Able to Communicate Effectively  Barriers:  none   Legal History: Pending legal issue / charges: The patient has no significant history of legal issues. History of legal issue /  charges:  n/a  Medical History/Surgical History: reviewed Past Medical History:  Diagnosis Date   Depression    GERD (gastroesophageal reflux disease)    pregnancy related-pepcid/prilosec    Past Surgical History:  Procedure Laterality Date   CESAREAN SECTION     CESAREAN SECTION  08/03/2011   Procedure: CESAREAN SECTION;  Surgeon: Loney Laurence, MD;  Location: [facility];  Service: Gynecology;  Laterality: N/A;  Repeat Cesarean Section Delivery  Boy  @ 0157,     tab  2007   WRIST FRACTURE SURGERY      Medications: Current Outpatient Medications  Medication Sig Dispense Refill   Drospirenone-Ethinyl Estradiol (NIKKI PO) Take 1 each by mouth once.     multivitamin-iron-minerals-folic acid (CENTRUM) chewable tablet Chew 1 tablet by mouth daily.     naproxen (NAPROSYN) 500 MG tablet Take 1 tablet (500 mg total) by mouth 2 (two) times daily. (Patient not taking: Reported on 08/14/2020) 30 tablet 0   oseltamivir (TAMIFLU) 75 MG capsule Take 75 mg by mouth daily. (Patient not taking: Reported on 08/14/2020)  0   terbinafine (LAMISIL) 250 MG tablet Please take one a day x 7days, repeat every 4 weeks x 4 months (Patient not taking: Reported on 08/14/2020) 28 tablet 0   terbinafine (LAMISIL) 250 MG tablet Please take one a day x 7days, repeat every 4 weeks x 4 months (Patient not taking: Reported on 08/14/2020) 28 tablet 0   terbinafine (LAMISIL) 250 MG tablet Please take one a day x 7days, repeat every 4 weeks x 4 months 28 tablet 0   valACYclovir (VALTREX) 1000 MG tablet Take 1,000 mg by mouth as needed.  (Patient not taking: Reported on 08/14/2020)  3   valACYclovir (VALTREX) 1000 MG tablet valacyclovir 1 gram tablet  TAKE 1 TABLET EVERY 12 HOURS     No current facility-administered medications for this visit.    Allergies  Allergen Reactions   Latex Itching    Vaginal irritation-itching , swelling   Sulfa Antibiotics     Lowers wbc     Diagnoses:  F43.21  Plan of Care: Treatment  Plan Client Abilities/Strengths  Pt is bright, engaging, and motivated for therapy.   Client Treatment Preferences  Individual therapy.  Client Statement of Needs  Improve coping skills.  Symptoms  Depressed or irritable mood. Low self-esteem. Unresolved grief issues.  Problems Addressed  Unipolar Depression Goals 1. Alleviate depressive symptoms and return to previous level of effective functioning. 2. Appropriately grieve the loss in order to normalize mood and to return to previously adaptive level of functioning. Objective Learn and implement behavioral strategies to overcome depression. Target Date: 2024-04-12 Frequency: Biweekly  Progress: 50 Modality: individual  Related Interventions Engage the client in "behavioral activation," increasing his/her activity level and contact with sources of reward, while identifying processes that inhibit activation.  Use behavioral techniques such as instruction, rehearsal, role-playing, role reversal, as needed, to facilitate activity in the client's daily life; reinforce success. Assist the client in developing skills that increase the likelihood of deriving pleasure from behavioral activation (e.g., assertiveness  skills, developing an exercise plan, less internal/more external focus, increased social involvement); reinforce success. Objective Identify important people in life, past and present, and describe the quality, good and poor, of those relationships. Target Date: 2024-04-12 Frequency: Biweekly  Progress: 50 Modality: individual  Related Interventions Conduct Interpersonal Therapy beginning with the assessment of the client's "interpersonal inventory" of important past and present relationships; develop a case formulation linking depression to grief, interpersonal role disputes, role transitions, and/or interpersonal deficits). Objective Learn and implement problem-solving and decision-making skills. Target Date: 2024-04-12  Frequency: Biweekly  Progress: 50 Modality: individual  Related Interventions Conduct Problem-Solving Therapy using techniques such as psychoeducation, modeling, and role-playing to teach client problem-solving skills (i.e., defining a problem specifically, generating possible solutions, evaluating the pros and cons of each solution, selecting and implementing a plan of action, evaluating the efficacy of the plan, accepting or revising the plan); role-play application of the problem-solving skill to a real life issue. Encourage in the client the development of a positive problem orientation in which problems and solving them are viewed as a natural part of life and not something to be feared, despaired, or avoided. 3. Develop healthy interpersonal relationships that lead to the alleviation and help prevent the relapse of depression. 4. Develop healthy thinking patterns and beliefs about self, others, and the world that lead to the alleviation and help prevent the relapse of depression. 5. Recognize, accept, and cope with feelings of depression. Diagnosis F43.21 Conditions For Discharge Achievement of treatment goals and objectives    Salomon Fick, LCSW

## 2023-04-27 ENCOUNTER — Ambulatory Visit: Payer: BC Managed Care – PPO | Admitting: Psychology

## 2023-05-19 ENCOUNTER — Ambulatory Visit (INDEPENDENT_AMBULATORY_CARE_PROVIDER_SITE_OTHER): Payer: BC Managed Care – PPO | Admitting: Psychology

## 2023-05-19 DIAGNOSIS — F4321 Adjustment disorder with depressed mood: Secondary | ICD-10-CM

## 2023-05-19 NOTE — Progress Notes (Signed)
 [facility] Counselor/Therapist Progress Note  Patient ID: Mackenzie Khan, MRN: [id number],    Date: 05/19/2023  Time Spent: 11:00am-11:50am   50 minutes   Treatment Type: Individual Therapy  Reported Symptoms: stress  Mental Status Exam: Appearance:  Casual     Behavior: Appropriate  Motor: Normal  Speech/Language:  Normal Rate  Affect: Appropriate  Mood: normal  Thought process: normal  Thought content:   WNL  Sensory/Perceptual disturbances:   WNL  Orientation: oriented to person, place, time/date, and situation  Attention: Good  Concentration: Good  Memory: WNL  Fund of knowledge:  Good  Insight:   Good  Judgment:  Good  Impulse Control: Good   Risk Assessment: Danger to Self:  No Self-injurious Behavior: No Danger to Others: No Duty to Warn:no Physical Aggression / Violence:No  Access to Firearms a concern: No  Gang Involvement:No   Subjective: Pt present for face-to-face individual therapy via video.  Pt consents to telehealth video session and is aware of limitations and benefits of virtual sessions. Location of pt: home Location of therapist: home office.   Pt talked about her children.  [place] had pt up a lot of last night so pt is very tired.   [name] children fight a lot as siblings and this is very stressful for pt.   She tends to feel responsible for making everyone happy.   Worked on pt's expectations of herself and making them more realistic.    Worked on parenting strategies. Pt is very hard on herself.   Worked on self compassion.   Worked on self care strategies.  Provided supportive therapy.    Interventions: Cognitive Behavioral Therapy and Insight-Oriented  Diagnosis:  F43.21  Plan of Care:   Recommend ongoing therapy.   Pt participated in setting treatment goals.  Plan to meet every two weeks.   Pt agrees with treatment plan.   Treatment Plan Client Abilities/Strengths  Pt is bright, engaging, and motivated for therapy.    Client Treatment Preferences  Individual therapy.  Client Statement of Needs  Improve coping skills.  Symptoms  Depressed or irritable mood. Low self-esteem. Unresolved grief issues.  Problems Addressed  Unipolar Depression Goals 1. Alleviate depressive symptoms and return to previous level of effective functioning. 2. Appropriately grieve the loss in order to normalize mood and to return to previously adaptive level of functioning. Objective Learn and implement behavioral strategies to overcome depression. Target Date: 2024-04-12 Frequency: Biweekly  Progress: 50 Modality: individual  Related Interventions Engage the client in behavioral activation, increasing his/her activity level and contact with sources of reward, while identifying processes that inhibit activation.  Use behavioral techniques such as instruction, rehearsal, role-playing, role reversal, as needed, to facilitate activity in the client's daily life; reinforce success. Assist the client in developing skills that increase the likelihood of deriving pleasure from behavioral activation (e.g., assertiveness skills, developing an exercise plan, less internal/more external focus, increased social involvement); reinforce success. Objective Identify important people in life, past and present, and describe the quality, good and poor, of those relationships. Target Date: 2024-04-12 Frequency: Biweekly  Progress: 50 Modality: individual  Related Interventions Conduct Interpersonal Therapy beginning with the assessment of the client's interpersonal inventory of important past and present relationships; develop a case formulation linking depression to grief, interpersonal role disputes, role transitions, and/or interpersonal deficits). Objective Learn and implement problem-solving and decision-making skills. Target Date: 2024-04-12 Frequency: Biweekly  Progress: 50 Modality: individual  Related Interventions Conduct  Problem-Solving Therapy using techniques such as  psychoeducation, modeling, and role-playing to teach client problem-solving skills (i.e., defining a problem specifically, generating possible solutions, evaluating the pros and cons of each solution, selecting and implementing a plan of action, evaluating the efficacy of the plan, accepting or revising the plan); role-play application of the problem-solving skill to a real life issue. Encourage in the client the development of a positive problem orientation in which problems and solving them are viewed as a natural part of life and not something to be feared, despaired, or avoided. 3. Develop healthy interpersonal relationships that lead to the alleviation and help prevent the relapse of depression. 4. Develop healthy thinking patterns and beliefs about self, others, and the world that lead to the alleviation and help prevent the relapse of depression. 5. Recognize, accept, and cope with feelings of depression. Diagnosis F43.21 Conditions For Discharge Achievement of treatment goals and objectives   Mackenzie Alma, LCSW

## 2023-06-01 ENCOUNTER — Ambulatory Visit: Payer: BC Managed Care – PPO | Admitting: Psychology

## 2023-06-01 DIAGNOSIS — F4321 Adjustment disorder with depressed mood: Secondary | ICD-10-CM

## 2023-06-01 NOTE — Progress Notes (Signed)
[facility] Counselor/Therapist Progress Note  Patient ID: DEANIA ROCKWOOD, MRN: [id number],    Date: 06/01/2023  Time Spent: 9:00am-9:55am   55 minutes   Treatment Type: Individual Therapy  Reported Symptoms: stress  Mental Status Exam: Appearance:  Casual     Behavior: Appropriate  Motor: Normal  Speech/Language:  Normal Rate  Affect: Appropriate  Mood: normal  Thought process: normal  Thought content:   WNL  Sensory/Perceptual disturbances:   WNL  Orientation: oriented to person, place, time/date, and situation  Attention: Good  Concentration: Good  Memory: WNL  Fund of knowledge:  Good  Insight:   Good  Judgment:  Good  Impulse Control: Good   Risk Assessment: Danger to Self:  No Self-injurious Behavior: No Danger to Others: No Duty to Warn:no Physical Aggression / Violence:No  Access to Firearms a concern: No  Gang Involvement:No   Subjective: Pt present for face-to-face individual therapy via video.  Pt consents to telehealth video session and is aware of limitations and benefits of virtual sessions. Location of pt: home Location of therapist: home office.   Pt talked about last week being the anniversary of her mother's death.  Addressed how this impacted pt and helped her process her feelings.  Pt talked about the different parts of herself.    She feels like she needs to talk more about "the crazy part" of herself.   She has some irrational fears that she struggles with.  She feels like she has to think about the worst thing that can happen and prepare for it as a way to guard against the worst thing happening.   Pt realizes this is irrational but feels compelled to think this way.  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   Pt is very hard on herself.   Worked on self compassion.   Worked on self care strategies.  Provided supportive therapy.    Interventions: Cognitive  Behavioral Therapy and Insight-Oriented  Diagnosis:  F43.21  Plan of Care:   Recommend ongoing therapy.   Pt participated in setting treatment goals.  Plan to meet every two weeks.   Pt agrees with treatment plan.   Treatment Plan Client Abilities/Strengths  Pt is bright, engaging, and motivated for therapy.   Client Treatment Preferences  Individual therapy.  Client Statement of Needs  Improve coping skills.  Symptoms  Depressed or irritable mood. Low self-esteem. Unresolved grief issues.  Problems Addressed  Unipolar Depression Goals 1. Alleviate depressive symptoms and return to previous level of effective functioning. 2. Appropriately grieve the loss in order to normalize mood and to return to previously adaptive level of functioning. Objective Learn and implement behavioral strategies to overcome depression. Target Date: 2025-04-12 Frequency: Biweekly  Progress: 50 Modality: individual  Related Interventions Engage the client in "behavioral activation," increasing his/her activity level and contact with sources of reward, while identifying processes that inhibit activation.  Use behavioral techniques such as instruction, rehearsal, role-playing, role reversal, as needed, to facilitate activity in the client's daily life; reinforce success. Assist the client in developing skills that increase the likelihood of deriving pleasure from behavioral activation (e.g., assertiveness skills, developing an exercise plan, less internal/more external focus, increased social involvement); reinforce success. Objective Identify important people in life, past and present, and describe the quality, good and poor, of those relationships. Target Date: 2025-04-12 Frequency: Biweekly  Progress: 50 Modality: individual  Related Interventions Conduct Interpersonal Therapy beginning with  the assessment of the client's "interpersonal inventory" of important past and present relationships; develop a case  formulation linking depression to grief, interpersonal role disputes, role transitions, and/or interpersonal deficits). Objective Learn and implement problem-solving and decision-making skills. Target Date: 2025-04-12 Frequency: Biweekly  Progress: 50 Modality: individual  Related Interventions Conduct Problem-Solving Therapy using techniques such as psychoeducation, modeling, and role-playing to teach client problem-solving skills (i.e., defining a problem specifically, generating possible solutions, evaluating the pros and cons of each solution, selecting and implementing a plan of action, evaluating the efficacy of the plan, accepting or revising the plan); role-play application of the problem-solving skill to a real life issue. Encourage in the client the development of a positive problem orientation in which problems and solving them are viewed as a natural part of life and not something to be feared, despaired, or avoided. 3. Develop healthy interpersonal relationships that lead to the alleviation and help prevent the relapse of depression. 4. Develop healthy thinking patterns and beliefs about self, others, and the world that lead to the alleviation and help prevent the relapse of depression. 5. Recognize, accept, and cope with feelings of depression. Diagnosis F43.21 Conditions For Discharge Achievement of treatment goals and objectives   Salomon Fick, LCSW

## 2023-06-13 NOTE — Progress Notes (Signed)
 This encounter was created in error - please disregard.

## 2023-06-23 ENCOUNTER — Ambulatory Visit: Payer: BC Managed Care – PPO | Admitting: Psychology

## 2023-06-23 DIAGNOSIS — F4321 Adjustment disorder with depressed mood: Secondary | ICD-10-CM | POA: Diagnosis not present

## 2023-06-23 NOTE — Progress Notes (Signed)
[facility] Counselor/Therapist Progress Note  Patient ID: Mackenzie Khan, MRN: [id number],    Date: 06/23/2023  Time Spent: 9:00am-9:55am   55 minutes   Treatment Type: Individual Therapy  Reported Symptoms: stress  Mental Status Exam: Appearance:  Casual     Behavior: Appropriate  Motor: Normal  Speech/Language:  Normal Rate  Affect: Appropriate  Mood: normal  Thought process: normal  Thought content:   WNL  Sensory/Perceptual disturbances:   WNL  Orientation: oriented to person, place, time/date, and situation  Attention: Good  Concentration: Good  Memory: WNL  Fund of knowledge:  Good  Insight:   Good  Judgment:  Good  Impulse Control: Good   Risk Assessment: Danger to Self:  No Self-injurious Behavior: No Danger to Others: No Duty to Warn:no Physical Aggression / Violence:No  Access to Firearms a concern: No  Gang Involvement:No   Subjective: Pt present for face-to-face individual therapy via video.  Pt consents to telehealth video session and is aware of limitations and benefits of virtual sessions. Location of pt: home Location of therapist: home office.   Pt talked about feeling overwhelmed since Trump took office as president.   Pt is very upset by a lot that is going on in the country.  Pt feels helpless and frustrated.   She feels angry and alarmed and is worrying.   Pt does not feel supported by her husband Arlys John regarding her feelings about politics.  Addressed the issues in pt's marriage that frustrate her.  Addressed how pt can accept who Arlys John is and ask for behavioral changes that are reasonable to request.  Helped pt process her feelings and relationship dynamics.   Worked on self care strategies.  Provided supportive therapy.    Interventions: Cognitive Behavioral Therapy and Insight-Oriented  Diagnosis:  F43.21  Plan of Care:   Recommend ongoing therapy.   Pt participated in setting treatment goals.  Plan to meet every two  weeks.   Pt agrees with treatment plan.   Treatment Plan Client Abilities/Strengths  Pt is bright, engaging, and motivated for therapy.   Client Treatment Preferences  Individual therapy.  Client Statement of Needs  Improve coping skills.  Symptoms  Depressed or irritable mood. Low self-esteem. Unresolved grief issues.  Problems Addressed  Unipolar Depression Goals 1. Alleviate depressive symptoms and return to previous level of effective functioning. 2. Appropriately grieve the loss in order to normalize mood and to return to previously adaptive level of functioning. Objective Learn and implement behavioral strategies to overcome depression. Target Date: 2025-04-12 Frequency: Biweekly  Progress: 50 Modality: individual  Related Interventions Engage the client in "behavioral activation," increasing his/her activity level and contact with sources of reward, while identifying processes that inhibit activation.  Use behavioral techniques such as instruction, rehearsal, role-playing, role reversal, as needed, to facilitate activity in the client's daily life; reinforce success. Assist the client in developing skills that increase the likelihood of deriving pleasure from behavioral activation (e.g., assertiveness skills, developing an exercise plan, less internal/more external focus, increased social involvement); reinforce success. Objective Identify important people in life, past and present, and describe the quality, good and poor, of those relationships. Target Date: 2025-04-12 Frequency: Biweekly  Progress: 50 Modality: individual  Related Interventions Conduct Interpersonal Therapy beginning with the assessment of the client's "interpersonal inventory" of important past and present relationships; develop a case formulation linking depression to grief, interpersonal role disputes, role transitions, and/or interpersonal deficits). Objective Learn and implement problem-solving and  decision-making skills. Target Date:  2025-04-12 Frequency: Biweekly  Progress: 50 Modality: individual  Related Interventions Conduct Problem-Solving Therapy using techniques such as psychoeducation, modeling, and role-playing to teach client problem-solving skills (i.e., defining a problem specifically, generating possible solutions, evaluating the pros and cons of each solution, selecting and implementing a plan of action, evaluating the efficacy of the plan, accepting or revising the plan); role-play application of the problem-solving skill to a real life issue. Encourage in the client the development of a positive problem orientation in which problems and solving them are viewed as a natural part of life and not something to be feared, despaired, or avoided. 3. Develop healthy interpersonal relationships that lead to the alleviation and help prevent the relapse of depression. 4. Develop healthy thinking patterns and beliefs about self, others, and the world that lead to the alleviation and help prevent the relapse of depression. 5. Recognize, accept, and cope with feelings of depression. Diagnosis F43.21 Conditions For Discharge Achievement of treatment goals and objectives   Salomon Fick, LCSW

## 2023-07-07 ENCOUNTER — Ambulatory Visit: Payer: BC Managed Care – PPO | Admitting: Psychology

## 2023-07-07 DIAGNOSIS — F4321 Adjustment disorder with depressed mood: Secondary | ICD-10-CM

## 2023-07-07 NOTE — Progress Notes (Signed)
 [facility] Counselor/Therapist Progress Note  Patient ID: Mackenzie Khan, MRN: [id number],    Date: 07/07/2023  Time Spent: 9:00am-9:55am   55 minutes   Treatment Type: Individual Therapy  Reported Symptoms: stress  Mental Status Exam: Appearance:  Casual     Behavior: Appropriate  Motor: Normal  Speech/Language:  Normal Rate  Affect: Appropriate  Mood: normal  Thought process: normal  Thought content:   WNL  Sensory/Perceptual disturbances:   WNL  Orientation: oriented to person, place, time/date, and situation  Attention: Good  Concentration: Good  Memory: WNL  Fund of knowledge:  Good  Insight:   Good  Judgment:  Good  Impulse Control: Good   Risk Assessment: Danger to Self:  No Self-injurious Behavior: No Danger to Others: No Duty to Warn:no Physical Aggression / Violence:No  Access to Firearms a concern: No  Gang Involvement:No   Subjective: Pt present for face-to-face individual therapy via video.  Pt consents to telehealth video session and is aware of limitations and benefits of virtual sessions. Location of pt: home Location of therapist: home office.   Pt talked about her frustration with politics and that her husband does not seem to care about it as much as she does.  Addressed the issues in pt's marriage that frustrate her.  Addressed how pt can accept who Arlys John is and ask for behavioral changes that are reasonable to request.  Helped pt process her feelings and relationship dynamics.   Pt talked about her fears, worries and ocd behavior.   She worries about things that are beyond her control and then engages in ocd behaviors in an attempt to quell her anxiety.   She feels if she does certain things she can control the outcomes of things that are beyond her control.    Worked with pt on her thought processes and worked on thought reframing.   Encouraged pt to interrupt her ruminative worrying with the self talk statement " I am not going  to go there bc it does not serve me well." Worked on self care strategies.  Provided supportive therapy.    Interventions: Cognitive Behavioral Therapy and Insight-Oriented  Diagnosis:  F43.21  Plan of Care:   Recommend ongoing therapy.   Pt participated in setting treatment goals.  Plan to meet every two weeks.   Pt agrees with treatment plan.   Treatment Plan Client Abilities/Strengths  Pt is bright, engaging, and motivated for therapy.   Client Treatment Preferences  Individual therapy.  Client Statement of Needs  Improve coping skills.  Symptoms  Depressed or irritable mood. Low self-esteem. Unresolved grief issues.  Problems Addressed  Unipolar Depression Goals 1. Alleviate depressive symptoms and return to previous level of effective functioning. 2. Appropriately grieve the loss in order to normalize mood and to return to previously adaptive level of functioning. Objective Learn and implement behavioral strategies to overcome depression. Target Date: 2024-04-12 Frequency: Biweekly  Progress: 50 Modality: individual  Related Interventions Engage the client in "behavioral activation," increasing his/her activity level and contact with sources of reward, while identifying processes that inhibit activation.  Use behavioral techniques such as instruction, rehearsal, role-playing, role reversal, as needed, to facilitate activity in the client's daily life; reinforce success. Assist the client in developing skills that increase the likelihood of deriving pleasure from behavioral activation (e.g., assertiveness skills, developing an exercise plan, less internal/more external focus, increased social involvement); reinforce success. Objective Identify important people in life, past and present, and describe the quality, good and  poor, of those relationships. Target Date: 2024-04-12 Frequency: Biweekly  Progress: 50 Modality: individual  Related Interventions Conduct Interpersonal  Therapy beginning with the assessment of the client's "interpersonal inventory" of important past and present relationships; develop a case formulation linking depression to grief, interpersonal role disputes, role transitions, and/or interpersonal deficits). Objective Learn and implement problem-solving and decision-making skills. Target Date: 2024-04-12 Frequency: Biweekly  Progress: 50 Modality: individual  Related Interventions Conduct Problem-Solving Therapy using techniques such as psychoeducation, modeling, and role-playing to teach client problem-solving skills (i.e., defining a problem specifically, generating possible solutions, evaluating the pros and cons of each solution, selecting and implementing a plan of action, evaluating the efficacy of the plan, accepting or revising the plan); role-play application of the problem-solving skill to a real life issue. Encourage in the client the development of a positive problem orientation in which problems and solving them are viewed as a natural part of life and not something to be feared, despaired, or avoided. 3. Develop healthy interpersonal relationships that lead to the alleviation and help prevent the relapse of depression. 4. Develop healthy thinking patterns and beliefs about self, others, and the world that lead to the alleviation and help prevent the relapse of depression. 5. Recognize, accept, and cope with feelings of depression. Diagnosis F43.21 Conditions For Discharge Achievement of treatment goals and objectives   Salomon Fick, LCSW

## 2023-07-19 ENCOUNTER — Other Ambulatory Visit: Payer: Self-pay | Admitting: Obstetrics and Gynecology

## 2023-07-19 DIAGNOSIS — R928 Other abnormal and inconclusive findings on diagnostic imaging of breast: Secondary | ICD-10-CM

## 2023-07-25 ENCOUNTER — Ambulatory Visit: Payer: BC Managed Care – PPO | Admitting: Psychology

## 2023-07-25 DIAGNOSIS — F4321 Adjustment disorder with depressed mood: Secondary | ICD-10-CM

## 2023-07-25 NOTE — Progress Notes (Signed)
 [facility] Counselor/Therapist Progress Note  Patient ID: Mackenzie Khan, MRN: [id number],    Date: 07/25/2023  Time Spent: 9:00am-9:55am   55 minutes   Treatment Type: Individual Therapy  Reported Symptoms: stress  Mental Status Exam: Appearance:  Casual     Behavior: Appropriate  Motor: Normal  Speech/Language:  Normal Rate  Affect: Appropriate  Mood: normal  Thought process: normal  Thought content:   WNL  Sensory/Perceptual disturbances:   WNL  Orientation: oriented to person, place, time/date, and situation  Attention: Good  Concentration: Good  Memory: WNL  Fund of knowledge:  Good  Insight:   Good  Judgment:  Good  Impulse Control: Good   Risk Assessment: Danger to Self:  No Self-injurious Behavior: No Danger to Others: No Duty to Warn:no Physical Aggression / Violence:No  Access to Firearms a concern: No  Gang Involvement:No   Subjective: Pt present for face-to-face individual therapy via video.  Pt consents to telehealth video session and is aware of limitations and benefits of virtual sessions. Location of pt: home Location of therapist: home office.   Pt talked about her health.   She had a mammogram and a concerning spot was detected and pt has to have a repeat mammogram and ultrasound.   Pt is worrying a lot and thinking about worst case scenario.  Pt is worried about what will happen if something happens to her.   Pt states her husband's "good enough is not enough."   Pt worries the kids would not be taken care of well.  Addressed pt's fears and worries.  Pt feels like there is no space for her to need things in her family.  Pt feels "less than" by her husband.  Helped pt process her feelings and relationship dynamics.   Pt talked about her kids.   [name] has autism and can be challenging to parent.  They had an incident last week at the dentist where [name] became combative.   Addressed the incident and how it impacted pt.   Worked  with pt on her thought processes and worked on thought reframing.   Encouraged pt to set a worry time for 10 minutes once a day. Worked on self care strategies.  Provided supportive therapy.    Interventions: Cognitive Behavioral Therapy and Insight-Oriented  Diagnosis:  F43.21  Plan of Care:   Recommend ongoing therapy.   Pt participated in setting treatment goals.  Plan to meet every two weeks.   Pt agrees with treatment plan.   Treatment Plan Client Abilities/Strengths  Pt is bright, engaging, and motivated for therapy.   Client Treatment Preferences  Individual therapy.  Client Statement of Needs  Improve coping skills.  Symptoms  Depressed or irritable mood. Low self-esteem. Unresolved grief issues.  Problems Addressed  Unipolar Depression Goals 1. Alleviate depressive symptoms and return to previous level of effective functioning. 2. Appropriately grieve the loss in order to normalize mood and to return to previously adaptive level of functioning. Objective Learn and implement behavioral strategies to overcome depression. Target Date: 2024-04-12 Frequency: Biweekly  Progress: 50 Modality: individual  Related Interventions Engage the client in "behavioral activation," increasing his/her activity level and contact with sources of reward, while identifying processes that inhibit activation.  Use behavioral techniques such as instruction, rehearsal, role-playing, role reversal, as needed, to facilitate activity in the client's daily life; reinforce success. Assist the client in developing skills that increase the likelihood of deriving pleasure from behavioral activation (e.g., assertiveness skills, developing an  exercise plan, less internal/more external focus, increased social involvement); reinforce success. Objective Identify important people in life, past and present, and describe the quality, good and poor, of those relationships. Target Date: 2024-04-12 Frequency: Biweekly   Progress: 50 Modality: individual  Related Interventions Conduct Interpersonal Therapy beginning with the assessment of the client's "interpersonal inventory" of important past and present relationships; develop a case formulation linking depression to grief, interpersonal role disputes, role transitions, and/or interpersonal deficits). Objective Learn and implement problem-solving and decision-making skills. Target Date: 2024-04-12 Frequency: Biweekly  Progress: 50 Modality: individual  Related Interventions Conduct Problem-Solving Therapy using techniques such as psychoeducation, modeling, and role-playing to teach client problem-solving skills (i.e., defining a problem specifically, generating possible solutions, evaluating the pros and cons of each solution, selecting and implementing a plan of action, evaluating the efficacy of the plan, accepting or revising the plan); role-play application of the problem-solving skill to a real life issue. Encourage in the client the development of a positive problem orientation in which problems and solving them are viewed as a natural part of life and not something to be feared, despaired, or avoided. 3. Develop healthy interpersonal relationships that lead to the alleviation and help prevent the relapse of depression. 4. Develop healthy thinking patterns and beliefs about self, others, and the world that lead to the alleviation and help prevent the relapse of depression. 5. Recognize, accept, and cope with feelings of depression. Diagnosis F43.21 Conditions For Discharge Achievement of treatment goals and objectives   Salomon Fick, LCSW

## 2023-07-27 ENCOUNTER — Ambulatory Visit
Admission: RE | Admit: 2023-07-27 | Discharge: 2023-07-27 | Disposition: A | Discharge status: Home / Self Care | Source: Ambulatory Visit | Attending: Obstetrics and Gynecology | Admitting: Obstetrics and Gynecology

## 2023-07-27 ENCOUNTER — Ambulatory Visit: Admission: RE | Admit: 2023-07-27 | Source: Ambulatory Visit

## 2023-07-27 DIAGNOSIS — R928 Other abnormal and inconclusive findings on diagnostic imaging of breast: Secondary | ICD-10-CM

## 2023-08-11 ENCOUNTER — Ambulatory Visit: Payer: BC Managed Care – PPO | Admitting: Psychology

## 2023-08-11 DIAGNOSIS — F4321 Adjustment disorder with depressed mood: Secondary | ICD-10-CM

## 2023-08-11 NOTE — Progress Notes (Signed)
 [facility] Counselor/Therapist Progress Note  Patient ID: Mackenzie Khan, MRN: [id number],    Date: 08/11/2023  Time Spent: 11:00am-11:55am   55 minutes   Treatment Type: Individual Therapy  Reported Symptoms: stress  Mental Status Exam: Appearance:  Casual     Behavior: Appropriate  Motor: Normal  Speech/Language:  Normal Rate  Affect: Appropriate  Mood: normal  Thought process: normal  Thought content:   WNL  Sensory/Perceptual disturbances:   WNL  Orientation: oriented to person, place, time/date, and situation  Attention: Good  Concentration: Good  Memory: WNL  Fund of knowledge:  Good  Insight:   Good  Judgment:  Good  Impulse Control: Good   Risk Assessment: Danger to Self:  No Self-injurious Behavior: No Danger to Others: No Duty to Warn:no Physical Aggression / Violence:No  Access to Firearms a concern: No  Gang Involvement:No   Subjective: Pt present for face-to-face individual therapy via video.  Pt consents to telehealth video session and is aware of limitations and benefits of virtual sessions. Location of pt: home Location of therapist: home office.   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    The trip went much better than she thought it would and she was able to have some time to herself every evening in the hotel.   Pt realized how much she needs some alone time and respite from her family.   Addressed the daily stress pt experiences when parenting her children and with issues with her husband.   Helped pt process her feelings and family dynamics.   Worked with pt on how she can create moments for herself to have time to herself or time with a friend.   Worked on self care strategies.  Provided supportive therapy.    Interventions: Cognitive Behavioral Therapy and Insight-Oriented  Diagnosis:  F43.21  Plan of Care:   Recommend ongoing therapy.   Pt participated in setting treatment goals.  Plan to meet every two  weeks.   Pt agrees with treatment plan.   Treatment Plan Client Abilities/Strengths  Pt is bright, engaging, and motivated for therapy.   Client Treatment Preferences  Individual therapy.  Client Statement of Needs  Improve coping skills.  Symptoms  Depressed or irritable mood. Low self-esteem. Unresolved grief issues.  Problems Addressed  Unipolar Depression Goals 1. Alleviate depressive symptoms and return to previous level of effective functioning. 2. Appropriately grieve the loss in order to normalize mood and to return to previously adaptive level of functioning. Objective Learn and implement behavioral strategies to overcome depression. Target Date: 2024-04-12 Frequency: Biweekly  Progress: 50 Modality: individual  Related Interventions Engage the client in "behavioral activation," increasing his/her activity level and contact with sources of reward, while identifying processes that inhibit activation.  Use behavioral techniques such as instruction, rehearsal, role-playing, role reversal, as needed, to facilitate activity in the client's daily life; reinforce success. Assist the client in developing skills that increase the likelihood of deriving pleasure from behavioral activation (e.g., assertiveness skills, developing an exercise plan, less internal/more external focus, increased social involvement); reinforce success. Objective Identify important people in life, past and present, and describe the quality, good and poor, of those relationships. Target Date: 2024-04-12 Frequency: Biweekly  Progress: 50 Modality: individual  Related Interventions Conduct Interpersonal Therapy beginning with the assessment of the client's "interpersonal inventory" of important past and present relationships; develop a case formulation linking depression to grief, interpersonal role disputes, role transitions, and/or interpersonal deficits). Objective Learn and implement  problem-solving and  decision-making skills. Target Date: 2024-04-12 Frequency: Biweekly  Progress: 50 Modality: individual  Related Interventions Conduct Problem-Solving Therapy using techniques such as psychoeducation, modeling, and role-playing to teach client problem-solving skills (i.e., defining a problem specifically, generating possible solutions, evaluating the pros and cons of each solution, selecting and implementing a plan of action, evaluating the efficacy of the plan, accepting or revising the plan); role-play application of the problem-solving skill to a real life issue. Encourage in the client the development of a positive problem orientation in which problems and solving them are viewed as a natural part of life and not something to be feared, despaired, or avoided. 3. Develop healthy interpersonal relationships that lead to the alleviation and help prevent the relapse of depression. 4. Develop healthy thinking patterns and beliefs about self, others, and the world that lead to the alleviation and help prevent the relapse of depression. 5. Recognize, accept, and cope with feelings of depression. Diagnosis F43.21 Conditions For Discharge Achievement of treatment goals and objectives   Salomon Fick, LCSW

## 2023-08-29 ENCOUNTER — Ambulatory Visit (INDEPENDENT_AMBULATORY_CARE_PROVIDER_SITE_OTHER): Payer: BC Managed Care – PPO | Admitting: Psychology

## 2023-08-29 DIAGNOSIS — F4321 Adjustment disorder with depressed mood: Secondary | ICD-10-CM

## 2023-08-29 NOTE — Progress Notes (Signed)
  Behavioral Health Counselor/Therapist Progress Note  Patient ID: Mackenzie Khan, MRN: [id number],    Date: 08/29/2023  Time Spent: 9:00am-9:55am   55 minutes   Treatment Type: Individual Therapy  Reported Symptoms: stress  Mental Status Exam: Appearance:  Casual     Behavior: Appropriate  Motor: Normal  Speech/Language:  Normal Rate  Affect: Appropriate  Mood: normal  Thought process: normal  Thought content:   WNL  Sensory/Perceptual disturbances:   WNL  Orientation: oriented to person, place, time/date, and situation  Attention: Good  Concentration: Good  Memory: WNL  Fund of knowledge:  Good  Insight:   Good  Judgment:  Good  Impulse Control: Good   Risk Assessment: Danger to Self:  No Self-injurious Behavior: No Danger to Others: No Duty to Warn:no Physical Aggression / Violence:No  Access to Firearms a concern: No  Gang Involvement:No   Subjective: Pt present for face-to-face individual therapy via video.  Pt consents to telehealth video session and is aware of limitations and benefits of virtual sessions. Location of pt: home Location of therapist: home office.   Pt talked about her father and his wife Cornelius Dill.   Pt hosted [employer] and had her father and Cornelius Dill at her home and they ended up leaving early.  Pt feels annoyed by Cornelius Dill.   [name] father goes along with anything Cornelius Dill wants and that bothers pt.   Addressed how pt can get time alone with her father so she can nurture their relationship without Cornelius Dill being there.   Pt talked about her kids.  Her twins will be starting high school in the fall.    Her son will go to [employer] and her daughter will go to [employer].    Addressed the daily stress pt experiences when parenting her children and with issues with her husband.   Helped pt process her feelings and family dynamics.   Pt states she has been trying to take care of herself.  She is exercising regularly.  She has been doing cardio twice a  week and doing weight training twice a week.  Pt feels healthier but she has not lost weight which is frustrating.  Pt needs to get a physical.  She has not been going for annual physicals.  Addressed how pt can take better care of her physical self.  She feels embarrassed about her weight which has kept her from going to the doctor.  Pt is worried about being judged.  Addressed pt's body image issues and self esteem.    Worked on self care strategies.  Provided supportive therapy.    Interventions: Cognitive Behavioral Therapy and Insight-Oriented  Diagnosis:  F43.21  Plan of Care:   Recommend ongoing therapy.   Pt participated in setting treatment goals.  Plan to meet every two weeks.   Pt agrees with treatment plan.   Treatment Plan Client Abilities/Strengths  Pt is bright, engaging, and motivated for therapy.   Client Treatment Preferences  Individual therapy.  Client Statement of Needs  Improve coping skills.  Symptoms  Depressed or irritable mood. Low self-esteem. Unresolved grief issues.  Problems Addressed  Unipolar Depression Goals 1. Alleviate depressive symptoms and return to previous level of effective functioning. 2. Appropriately grieve the loss in order to normalize mood and to return to previously adaptive level of functioning. Objective Learn and implement behavioral strategies to overcome depression. Target Date: 2024-04-12 Frequency: Biweekly  Progress: 50 Modality: individual  Related Interventions Engage the client in "behavioral activation," increasing  his/her activity level and contact with sources of reward, while identifying processes that inhibit activation.  Use behavioral techniques such as instruction, rehearsal, role-playing, role reversal, as needed, to facilitate activity in the client's daily life; reinforce success. Assist the client in developing skills that increase the likelihood of deriving pleasure from behavioral activation (e.g., assertiveness  skills, developing an exercise plan, less internal/more external focus, increased social involvement); reinforce success. Objective Identify important people in life, past and present, and describe the quality, good and poor, of those relationships. Target Date: 2024-04-12 Frequency: Biweekly  Progress: 50 Modality: individual  Related Interventions Conduct Interpersonal Therapy beginning with the assessment of the client's "interpersonal inventory" of important past and present relationships; develop a case formulation linking depression to grief, interpersonal role disputes, role transitions, and/or interpersonal deficits). Objective Learn and implement problem-solving and decision-making skills. Target Date: 2024-04-12 Frequency: Biweekly  Progress: 50 Modality: individual  Related Interventions Conduct Problem-Solving Therapy using techniques such as psychoeducation, modeling, and role-playing to teach client problem-solving skills (i.e., defining a problem specifically, generating possible solutions, evaluating the pros and cons of each solution, selecting and implementing a plan of action, evaluating the efficacy of the plan, accepting or revising the plan); role-play application of the problem-solving skill to a real life issue. Encourage in the client the development of a positive problem orientation in which problems and solving them are viewed as a natural part of life and not something to be feared, despaired, or avoided. 3. Develop healthy interpersonal relationships that lead to the alleviation and help prevent the relapse of depression. 4. Develop healthy thinking patterns and beliefs about self, others, and the world that lead to the alleviation and help prevent the relapse of depression. 5. Recognize, accept, and cope with feelings of depression. Diagnosis F43.21 Conditions For Discharge Achievement of treatment goals and objectives   Willey Harrier, LCSW

## 2023-09-21 ENCOUNTER — Ambulatory Visit: Admitting: Psychology

## 2023-10-05 ENCOUNTER — Ambulatory Visit: Admitting: Psychology

## 2023-10-24 ENCOUNTER — Ambulatory Visit: Admitting: Psychology

## 2023-10-27 ENCOUNTER — Ambulatory Visit (INDEPENDENT_AMBULATORY_CARE_PROVIDER_SITE_OTHER): Admitting: Psychology

## 2023-10-27 DIAGNOSIS — F4321 Adjustment disorder with depressed mood: Secondary | ICD-10-CM

## 2023-10-27 NOTE — Progress Notes (Signed)
 [facility] Counselor/Therapist Progress Note  Patient ID: EYANA STOLZE, MRN: [id number],    Date: 10/27/2023  Time Spent: 10:00am-10:55am   55 minutes   Treatment Type: Individual Therapy  Reported Symptoms: stress  Mental Status Exam: Appearance:  Casual     Behavior: Appropriate  Motor: Normal  Speech/Language:  Normal Rate  Affect: Appropriate  Mood: normal  Thought process: normal  Thought content:   WNL  Sensory/Perceptual disturbances:   WNL  Orientation: oriented to person, place, time/date, and situation  Attention: Good  Concentration: Good  Memory: WNL  Fund of knowledge:  Good  Insight:   Good  Judgment:  Good  Impulse Control: Good   Risk Assessment: Danger to Self:  No Self-injurious Behavior: No Danger to Others: No Duty to Warn:no Physical Aggression / Violence:No  Access to Firearms a concern: No  Gang Involvement:No   Subjective: Pt present for face-to-face individual therapy via video.  Pt consents to telehealth video session and is aware of limitations and benefits of virtual sessions. Location of pt: home Location of therapist: home office.   Pt talked about her husband Polly Brink having heart surgery last month.  He had 90% blockage in arteries before the surgery.  The surgery went well and pt's husband is still recuperating.  Pt has been very worried about her husband.  Pt is doing a lot of care giving for her husband.  Pt is having to also take care of her 3 children.  Pt has been disappointed in her father not stepping up to help in ways she has needed.  Addressed how pt can ask for what she needs.  Pt is having back pain and doesn't know what she can do to take care of herself.   Worked on self care strategies.  Provided supportive therapy.    Interventions: Cognitive Behavioral Therapy and Insight-Oriented  Diagnosis:  F43.21  Plan of Care:   Recommend ongoing therapy.   Pt participated in setting treatment goals.   Plan to meet every two weeks.   Pt agrees with treatment plan.   Treatment Plan Client Abilities/Strengths  Pt is bright, engaging, and motivated for therapy.   Client Treatment Preferences  Individual therapy.  Client Statement of Needs  Improve coping skills.  Symptoms  Depressed or irritable mood. Low self-esteem. Unresolved grief issues.  Problems Addressed  Unipolar Depression Goals 1. Alleviate depressive symptoms and return to previous level of effective functioning. 2. Appropriately grieve the loss in order to normalize mood and to return to previously adaptive level of functioning. Objective Learn and implement behavioral strategies to overcome depression. Target Date: 2024-04-12 Frequency: Biweekly  Progress: 50 Modality: individual  Related Interventions Engage the client in behavioral activation, increasing his/her activity level and contact with sources of reward, while identifying processes that inhibit activation.  Use behavioral techniques such as instruction, rehearsal, role-playing, role reversal, as needed, to facilitate activity in the client's daily life; reinforce success. Assist the client in developing skills that increase the likelihood of deriving pleasure from behavioral activation (e.g., assertiveness skills, developing an exercise plan, less internal/more external focus, increased social involvement); reinforce success. Objective Identify important people in life, past and present, and describe the quality, good and poor, of those relationships. Target Date: 2024-04-12 Frequency: Biweekly  Progress: 50 Modality: individual  Related Interventions Conduct Interpersonal Therapy beginning with the assessment of the client's interpersonal inventory of important past and present relationships; develop a case formulation linking depression to grief, interpersonal role disputes, role transitions,  and/or interpersonal deficits). Objective Learn and implement  problem-solving and decision-making skills. Target Date: 2024-04-12 Frequency: Biweekly  Progress: 50 Modality: individual  Related Interventions Conduct Problem-Solving Therapy using techniques such as psychoeducation, modeling, and role-playing to teach client problem-solving skills (i.e., defining a problem specifically, generating possible solutions, evaluating the pros and cons of each solution, selecting and implementing a plan of action, evaluating the efficacy of the plan, accepting or revising the plan); role-play application of the problem-solving skill to a real life issue. Encourage in the client the development of a positive problem orientation in which problems and solving them are viewed as a natural part of life and not something to be feared, despaired, or avoided. 3. Develop healthy interpersonal relationships that lead to the alleviation and help prevent the relapse of depression. 4. Develop healthy thinking patterns and beliefs about self, others, and the world that lead to the alleviation and help prevent the relapse of depression. 5. Recognize, accept, and cope with feelings of depression. Diagnosis F43.21 Conditions For Discharge Achievement of treatment goals and objectives   Willey Harrier, LCSW                  Lucah Petta [place], LCSW

## 2023-11-15 ENCOUNTER — Ambulatory Visit (INDEPENDENT_AMBULATORY_CARE_PROVIDER_SITE_OTHER): Admitting: Psychology

## 2023-11-15 DIAGNOSIS — F4321 Adjustment disorder with depressed mood: Secondary | ICD-10-CM | POA: Diagnosis not present

## 2023-11-15 NOTE — Progress Notes (Signed)
 [facility] Counselor/Therapist Progress Note  Patient ID: Mackenzie Khan, MRN: [id number],    Date: 11/15/2023  Time Spent: 10:00am-10:55am   55 minutes   Treatment Type: Individual Therapy  Reported Symptoms: stress, anxiety  Mental Status Exam: Appearance:  Casual     Behavior: Appropriate  Motor: Normal  Speech/Language:  Normal Rate  Affect: Appropriate  Mood: normal  Thought process: normal  Thought content:   WNL  Sensory/Perceptual disturbances:   WNL  Orientation: oriented to person, place, time/date, and situation  Attention: Good  Concentration: Good  Memory: WNL  Fund of knowledge:  Good  Insight:   Good  Judgment:  Good  Impulse Control: Good   Risk Assessment: Danger to Self:  No Self-injurious Behavior: No Danger to Others: No Duty to Warn:no Physical Aggression / Violence:No  Access to Firearms a concern: No  Gang Involvement:No   Subjective: Pt present for face-to-face individual therapy via video.  Pt consents to telehealth video session and is aware of limitations and benefits of virtual sessions. Location of pt: home Location of therapist: home office.   Pt talked about being tired bc she has not gotten much sleep since her husband Redell had open heart surgery 6 weeks ago.   Since her husband has not been able to do things pt is more appreciative of how much her husband was helping out in the evenings.   Pt thinks in the long run this hardship will help their marriage bc they are appreciating each other more.  They have been connecting more than they have in a long time.  Pt talked about her back hurting her and her sciatica has flared up.  Addressed how pt can take care of herself. Pt has been thinking about her ocd and attempts to control things that are beyond her control.    Pt engages in magical thinking that if she prepares for the worst case scenario then it won't happen.    Addressed pt's irrational thoughts and worked on  thought reframing.  Pt talked about her kids and the challenging of parenting teens.   Worked on parenting strategies. Worked on self care strategies.  Provided supportive therapy.    Interventions: Cognitive Behavioral Therapy and Insight-Oriented  Diagnosis:  F43.21  Plan of Care:   Recommend ongoing therapy.   Pt participated in setting treatment goals.  Plan to meet every two weeks.   Pt agrees with treatment plan.   Treatment Plan Client Abilities/Strengths  Pt is bright, engaging, and motivated for therapy.   Client Treatment Preferences  Individual therapy.  Client Statement of Needs  Improve coping skills.  Symptoms  Depressed or irritable mood. Low self-esteem. Unresolved grief issues.  Problems Addressed  Unipolar Depression Goals 1. Alleviate depressive symptoms and return to previous level of effective functioning. 2. Appropriately grieve the loss in order to normalize mood and to return to previously adaptive level of functioning. Objective Learn and implement behavioral strategies to overcome depression. Target Date: 2024-04-12 Frequency: Biweekly  Progress: 50 Modality: individual  Related Interventions Engage the client in behavioral activation, increasing his/her activity level and contact with sources of reward, while identifying processes that inhibit activation.  Use behavioral techniques such as instruction, rehearsal, role-playing, role reversal, as needed, to facilitate activity in the client's daily life; reinforce success. Assist the client in developing skills that increase the likelihood of deriving pleasure from behavioral activation (e.g., assertiveness skills, developing an exercise plan, less internal/more external focus, increased social involvement); reinforce success.  Objective Identify important people in life, past and present, and describe the quality, good and poor, of those relationships. Target Date: 2024-04-12 Frequency: Biweekly   Progress: 50 Modality: individual  Related Interventions Conduct Interpersonal Therapy beginning with the assessment of the client's interpersonal inventory of important past and present relationships; develop a case formulation linking depression to grief, interpersonal role disputes, role transitions, and/or interpersonal deficits). Objective Learn and implement problem-solving and decision-making skills. Target Date: 2024-04-12 Frequency: Biweekly  Progress: 50 Modality: individual  Related Interventions Conduct Problem-Solving Therapy using techniques such as psychoeducation, modeling, and role-playing to teach client problem-solving skills (i.e., defining a problem specifically, generating possible solutions, evaluating the pros and cons of each solution, selecting and implementing a plan of action, evaluating the efficacy of the plan, accepting or revising the plan); role-play application of the problem-solving skill to a real life issue. Encourage in the client the development of a positive problem orientation in which problems and solving them are viewed as a natural part of life and not something to be feared, despaired, or avoided. 3. Develop healthy interpersonal relationships that lead to the alleviation and help prevent the relapse of depression. 4. Develop healthy thinking patterns and beliefs about self, others, and the world that lead to the alleviation and help prevent the relapse of depression. 5. Recognize, accept, and cope with feelings of depression. Diagnosis F43.21 Conditions For Discharge Achievement of treatment goals and objectives   Veva Alma, LCSW

## 2023-12-07 ENCOUNTER — Ambulatory Visit (INDEPENDENT_AMBULATORY_CARE_PROVIDER_SITE_OTHER): Admitting: Psychology

## 2023-12-07 DIAGNOSIS — F4321 Adjustment disorder with depressed mood: Secondary | ICD-10-CM | POA: Diagnosis not present

## 2023-12-07 NOTE — Progress Notes (Signed)
 [facility] Counselor/Therapist Progress Note  Patient ID: DESHANTE CASSELL, MRN: [id number],    Date: 12/07/2023  Time Spent: 10:00am-10:55am   55 minutes   Treatment Type: Individual Therapy  Reported Symptoms: stress, anxiety  Mental Status Exam: Appearance:  Casual     Behavior: Appropriate  Motor: Normal  Speech/Language:  Normal Rate  Affect: Appropriate  Mood: normal  Thought process: normal  Thought content:   WNL  Sensory/Perceptual disturbances:   WNL  Orientation: oriented to person, place, time/date, and situation  Attention: Good  Concentration: Good  Memory: WNL  Fund of knowledge:  Good  Insight:   Good  Judgment:  Good  Impulse Control: Good   Risk Assessment: Danger to Self:  No Self-injurious Behavior: No Danger to Others: No Duty to Warn:no Physical Aggression / Violence:No  Access to Firearms a concern: No  Gang Involvement:No   Subjective: Pt present for face-to-face individual therapy via video.  Pt consents to telehealth video session and is aware of limitations and benefits of virtual sessions. Location of pt: home Location of therapist: home office.   Pt talked about not feeling good about herself bc she has been care giving so much for her husband that she has not gotten other things done.  Pt has been very hard on herself and feels like she needs to be a perfect parent.   Addressed pt's perfectionism and worked on how she can be more compassionate with herself.   Pt has neglected self care.  Worked with pt on how she can make self care a priority.   Set goal for pt to make a PCP appointment to attend to her health issues.    Pt talked about being an empath and having trouble not taking responsibility for other people's feelings.   Worked on health boundary setting.  Pt talked about her kids and the challenging of parenting teens.   Worked on parenting strategies. Worked on self care strategies.  Provided supportive therapy.     Interventions: Cognitive Behavioral Therapy and Insight-Oriented  Diagnosis:  F43.21  Plan of Care:   Recommend ongoing therapy.   Pt participated in setting treatment goals.  Plan to meet every two weeks.   Pt agrees with treatment plan.   Treatment Plan Client Abilities/Strengths  Pt is bright, engaging, and motivated for therapy.   Client Treatment Preferences  Individual therapy.  Client Statement of Needs  Improve coping skills.  Symptoms  Depressed or irritable mood. Low self-esteem. Unresolved grief issues.  Problems Addressed  Unipolar Depression Goals 1. Alleviate depressive symptoms and return to previous level of effective functioning. 2. Appropriately grieve the loss in order to normalize mood and to return to previously adaptive level of functioning. Objective Learn and implement behavioral strategies to overcome depression. Target Date: 2024-04-12 Frequency: Biweekly  Progress: 50 Modality: individual  Related Interventions Engage the client in behavioral activation, increasing his/her activity level and contact with sources of reward, while identifying processes that inhibit activation.  Use behavioral techniques such as instruction, rehearsal, role-playing, role reversal, as needed, to facilitate activity in the client's daily life; reinforce success. Assist the client in developing skills that increase the likelihood of deriving pleasure from behavioral activation (e.g., assertiveness skills, developing an exercise plan, less internal/more external focus, increased social involvement); reinforce success. Objective Identify important people in life, past and present, and describe the quality, good and poor, of those relationships. Target Date: 2024-04-12 Frequency: Biweekly  Progress: 50 Modality: individual  Related Interventions Conduct  Interpersonal Therapy beginning with the assessment of the client's interpersonal inventory of important past and present  relationships; develop a case formulation linking depression to grief, interpersonal role disputes, role transitions, and/or interpersonal deficits). Objective Learn and implement problem-solving and decision-making skills. Target Date: 2024-04-12 Frequency: Biweekly  Progress: 50 Modality: individual  Related Interventions Conduct Problem-Solving Therapy using techniques such as psychoeducation, modeling, and role-playing to teach client problem-solving skills (i.e., defining a problem specifically, generating possible solutions, evaluating the pros and cons of each solution, selecting and implementing a plan of action, evaluating the efficacy of the plan, accepting or revising the plan); role-play application of the problem-solving skill to a real life issue. Encourage in the client the development of a positive problem orientation in which problems and solving them are viewed as a natural part of life and not something to be feared, despaired, or avoided. 3. Develop healthy interpersonal relationships that lead to the alleviation and help prevent the relapse of depression. 4. Develop healthy thinking patterns and beliefs about self, others, and the world that lead to the alleviation and help prevent the relapse of depression. 5. Recognize, accept, and cope with feelings of depression. Diagnosis F43.21 Conditions For Discharge Achievement of treatment goals and objectives   Veva Alma, LCSW

## 2023-12-26 ENCOUNTER — Ambulatory Visit (INDEPENDENT_AMBULATORY_CARE_PROVIDER_SITE_OTHER): Admitting: Psychology

## 2023-12-26 DIAGNOSIS — F4321 Adjustment disorder with depressed mood: Secondary | ICD-10-CM

## 2023-12-26 NOTE — Progress Notes (Signed)
  Behavioral Health Counselor/Therapist Progress Note  Patient ID: Mackenzie Khan, MRN: [id number],    Date: 12/26/2023  Time Spent: 9:00am-9:55am   55 minutes   Treatment Type: Individual Therapy  Reported Symptoms: stress, anxiety  Mental Status Exam: Appearance:  Casual     Behavior: Appropriate  Motor: Normal  Speech/Language:  Normal Rate  Affect: Appropriate  Mood: normal  Thought process: normal  Thought content:   WNL  Sensory/Perceptual disturbances:   WNL  Orientation: oriented to person, place, time/date, and situation  Attention: Good  Concentration: Good  Memory: WNL  Fund of knowledge:  Good  Insight:   Good  Judgment:  Good  Impulse Control: Good   Risk Assessment: Danger to Self:  No Self-injurious Behavior: No Danger to Others: No Duty to Warn:no Physical Aggression / Violence:No  Access to Firearms a concern: No  Gang Involvement:No   Subjective: Pt present for face-to-face individual therapy via video.  Pt consents to telehealth video session and is aware of limitations and benefits of virtual sessions. Location of pt: home Location of therapist: home office.   Pt talked about going to the beach last week with her family and the trip went well.   Pt  talked about her son starting high school at [employer] where pt attended high school.  Pt saw a lot of women she went to high school with and she started to compare herself bc they are thin and pt has gained weight.   Addressed pt's negative self talk and self comparison.   Worked on self esteem and body image issues.    Encouraged pt to read about body kindness and intuitive eating practices.   Pt has made doctors appointments to increase her self care which is positive.   Worked on additional self care strategies.  Provided supportive therapy.    Interventions: Cognitive Behavioral Therapy and Insight-Oriented  Diagnosis:  F43.21  Plan of Care:   Recommend ongoing therapy.   Pt  participated in setting treatment goals.  Plan to meet every two weeks.   Pt agrees with treatment plan.   Treatment Plan Client Abilities/Strengths  Pt is bright, engaging, and motivated for therapy.   Client Treatment Preferences  Individual therapy.  Client Statement of Needs  Improve coping skills.  Symptoms  Depressed or irritable mood. Low self-esteem. Unresolved grief issues.  Problems Addressed  Unipolar Depression Goals 1. Alleviate depressive symptoms and return to previous level of effective functioning. 2. Appropriately grieve the loss in order to normalize mood and to return to previously adaptive level of functioning. Objective Learn and implement behavioral strategies to overcome depression. Target Date: 2024-04-12 Frequency: Biweekly  Progress: 50 Modality: individual  Related Interventions Engage the client in behavioral activation, increasing his/her activity level and contact with sources of reward, while identifying processes that inhibit activation.  Use behavioral techniques such as instruction, rehearsal, role-playing, role reversal, as needed, to facilitate activity in the client's daily life; reinforce success. Assist the client in developing skills that increase the likelihood of deriving pleasure from behavioral activation (e.g., assertiveness skills, developing an exercise plan, less internal/more external focus, increased social involvement); reinforce success. Objective Identify important people in life, past and present, and describe the quality, good and poor, of those relationships. Target Date: 2024-04-12 Frequency: Biweekly  Progress: 50 Modality: individual  Related Interventions Conduct Interpersonal Therapy beginning with the assessment of the client's interpersonal inventory of important past and present relationships; develop a case formulation linking depression to grief, interpersonal  role disputes, role transitions, and/or interpersonal  deficits). Objective Learn and implement problem-solving and decision-making skills. Target Date: 2024-04-12 Frequency: Biweekly  Progress: 50 Modality: individual  Related Interventions Conduct Problem-Solving Therapy using techniques such as psychoeducation, modeling, and role-playing to teach client problem-solving skills (i.e., defining a problem specifically, generating possible solutions, evaluating the pros and cons of each solution, selecting and implementing a plan of action, evaluating the efficacy of the plan, accepting or revising the plan); role-play application of the problem-solving skill to a real life issue. Encourage in the client the development of a positive problem orientation in which problems and solving them are viewed as a natural part of life and not something to be feared, despaired, or avoided. 3. Develop healthy interpersonal relationships that lead to the alleviation and help prevent the relapse of depression. 4. Develop healthy thinking patterns and beliefs about self, others, and the world that lead to the alleviation and help prevent the relapse of depression. 5. Recognize, accept, and cope with feelings of depression. Diagnosis F43.21 Conditions For Discharge Achievement of treatment goals and objectives   Veva Alma, LCSW

## 2024-01-16 IMAGING — US US BREAST*R* LIMITED INC AXILLA
1 series · 7 of 7 positions shown · non-contrast
Comparison: Previous exam(s).

CLINICAL DATA: The patient was called back for a possible right
breast mass.

EXAM:
DIGITAL DIAGNOSTIC UNILATERAL RIGHT MAMMOGRAM WITH TOMOSYNTHESIS AND
CAD; ULTRASOUND RIGHT BREAST LIMITED
TECHNIQUE: Right digital diagnostic mammography and breast tomosynthesis was
performed. The images were evaluated with computer-aided detection.;
Targeted ultrasound examination of the right breast was performed

[Series 1: us breast*right* limited inc axilla · 0.07mm/px · 7 of 7 slices shown]
[im 1/7]
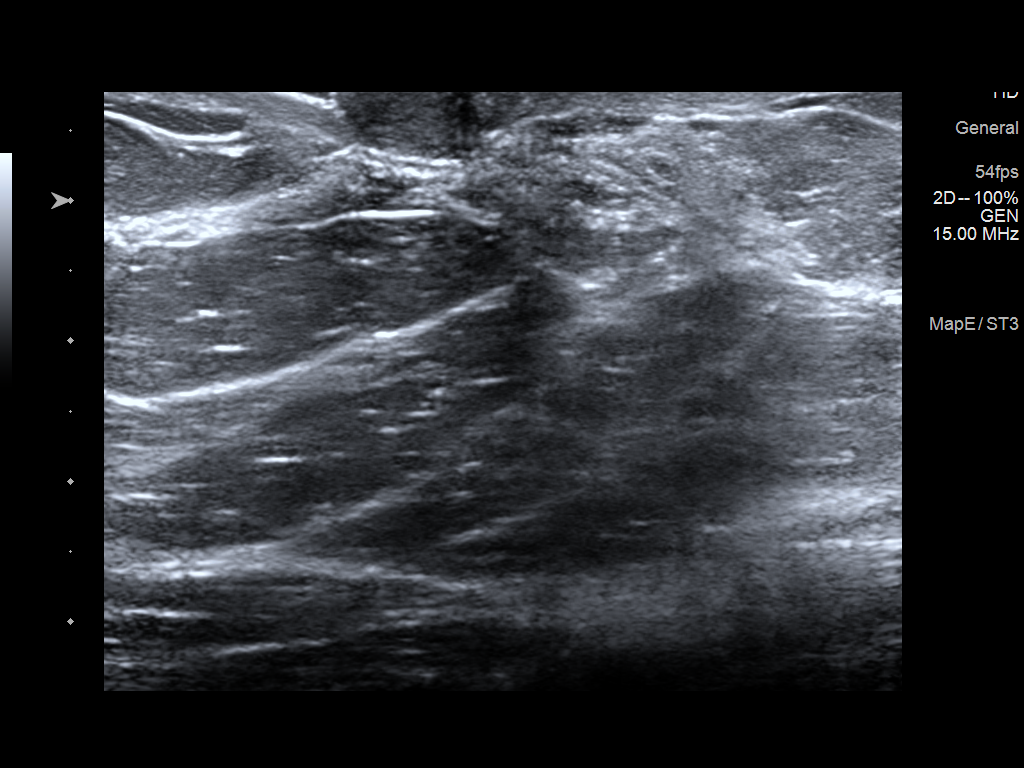
[im 2/7]
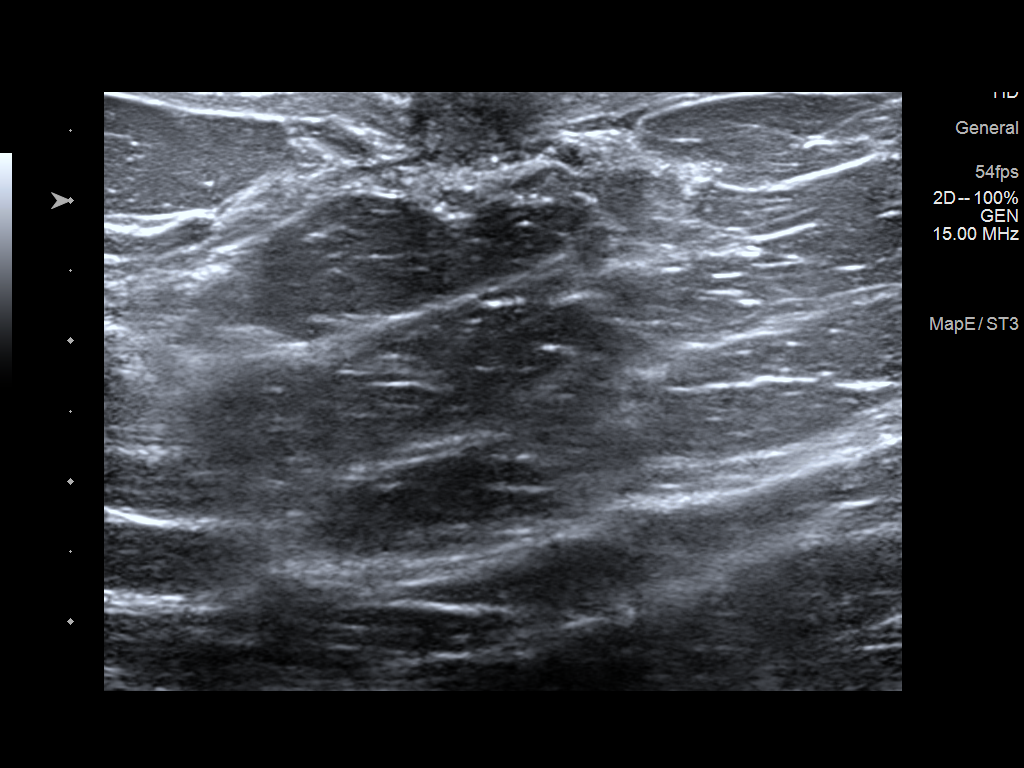
[im 3/7]
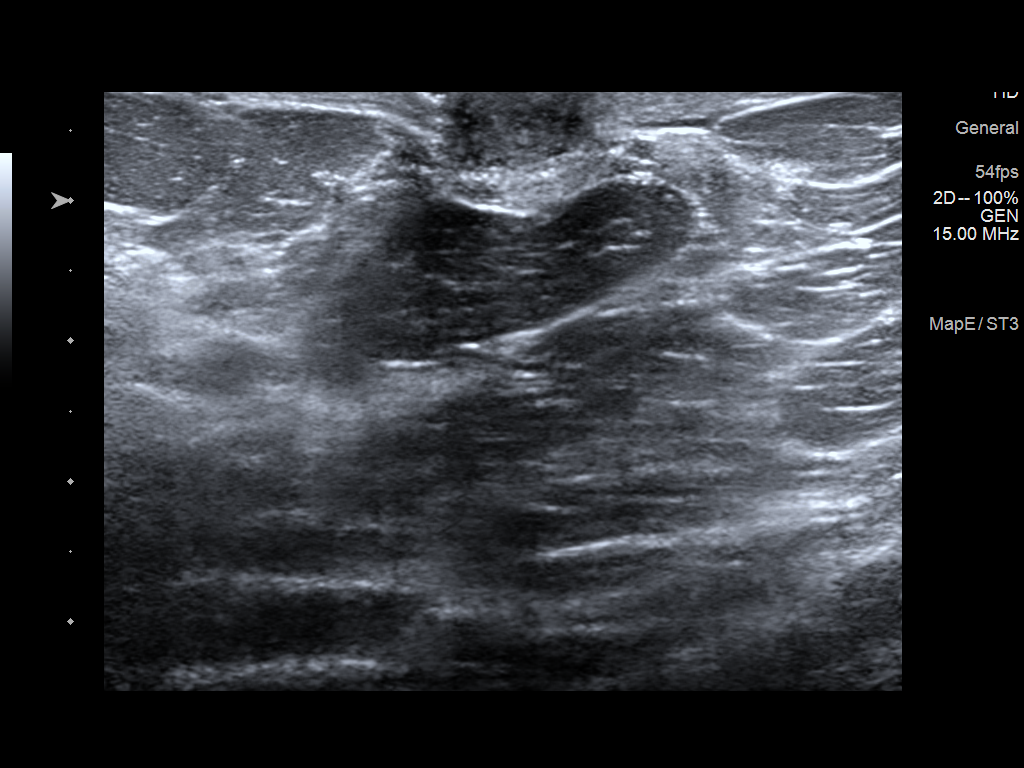
[im 4/7]
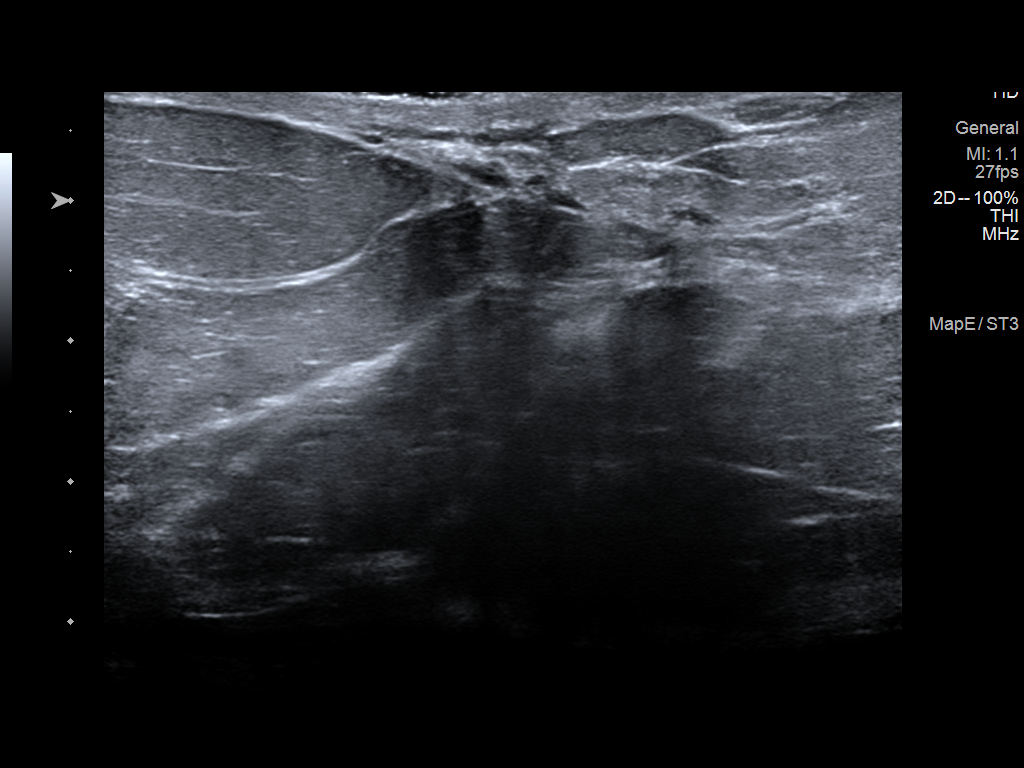
[im 5/7]
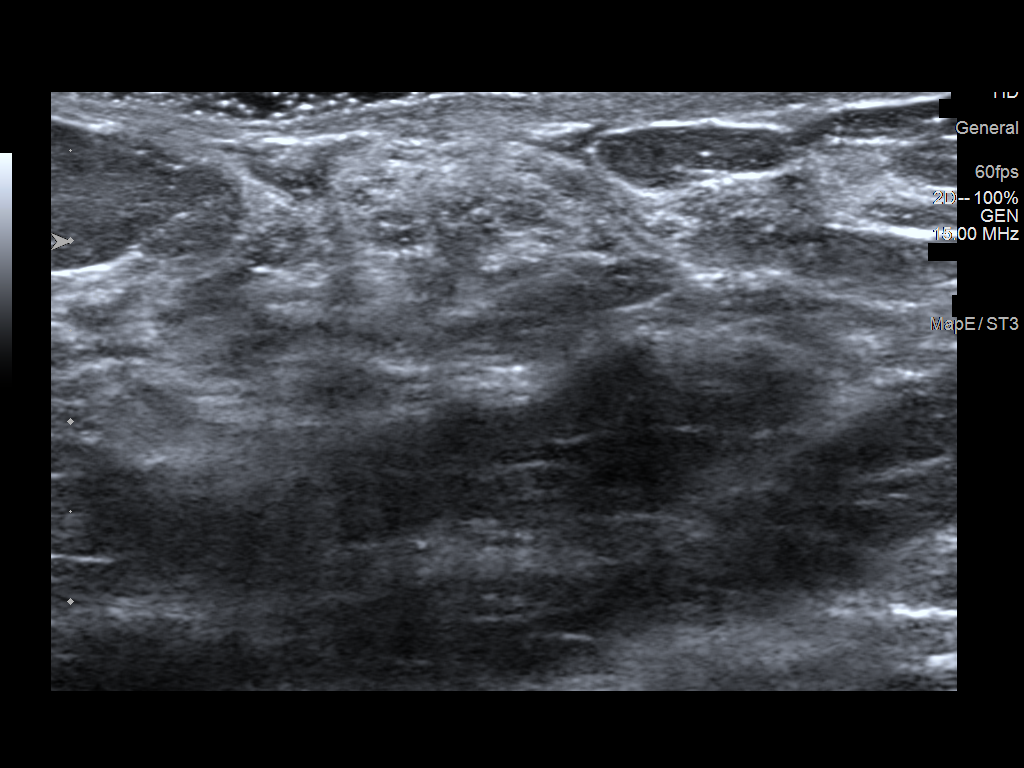
[im 6/7]
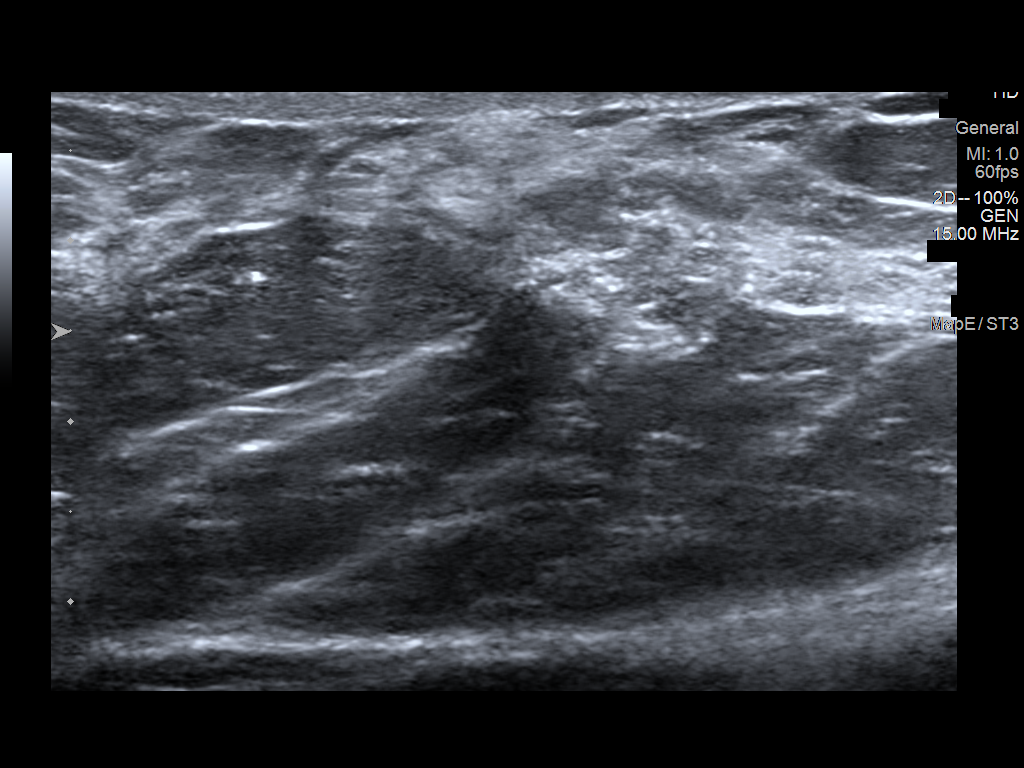
[im 7/7]
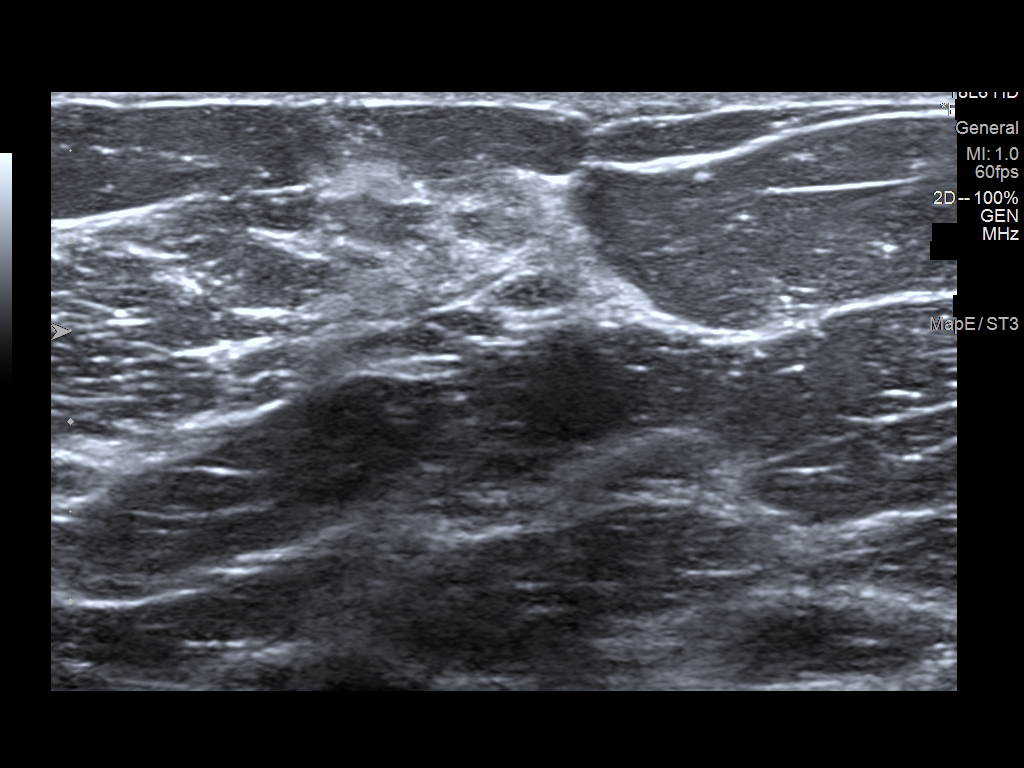

[7 of 7 positions shown; findings below may reference images not displayed]

ACR Breast Density Category c: The breast tissue is heterogeneously
dense, which may obscure small masses.
FINDINGS: The possible right breast mass resolves on additional imaging. No
discrete mass identified.

Targeted ultrasound is performed, showing no sonographic
abnormalities.
IMPRESSION: No mammographic or sonographic evidence of malignancy in the right
breast.

RECOMMENDATION:
Annual screening mammography.

I have discussed the findings and recommendations with the patient.
If applicable, a reminder letter will be sent to the patient
regarding the next appointment.

BI-RADS CATEGORY  1: Negative.

## 2024-01-16 IMAGING — MG MM DIGITAL DIAGNOSTIC UNILAT*R* W/ TOMO W/ CAD
4 series · 4 of 12 positions shown · non-contrast
Comparison: Previous exam(s).

CLINICAL DATA: The patient was called back for a possible right
breast mass.

EXAM:
DIGITAL DIAGNOSTIC UNILATERAL RIGHT MAMMOGRAM WITH TOMOSYNTHESIS AND
CAD; ULTRASOUND RIGHT BREAST LIMITED
TECHNIQUE: Right digital diagnostic mammography and breast tomosynthesis was
performed. The images were evaluated with computer-aided detection.;
Targeted ultrasound examination of the right breast was performed

[R CC synth-2D]
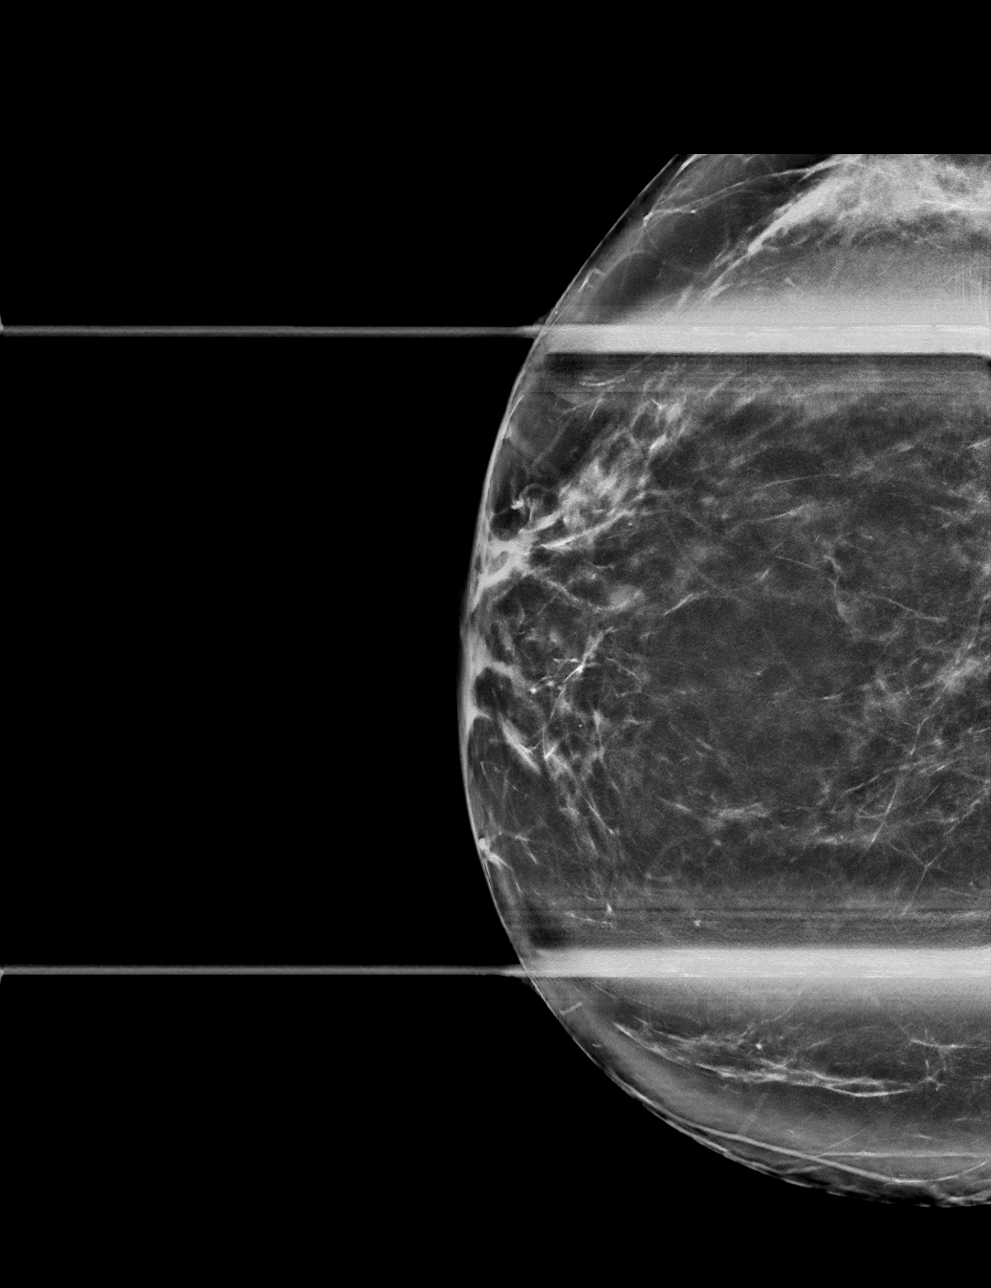

[R MLO synth-2D]
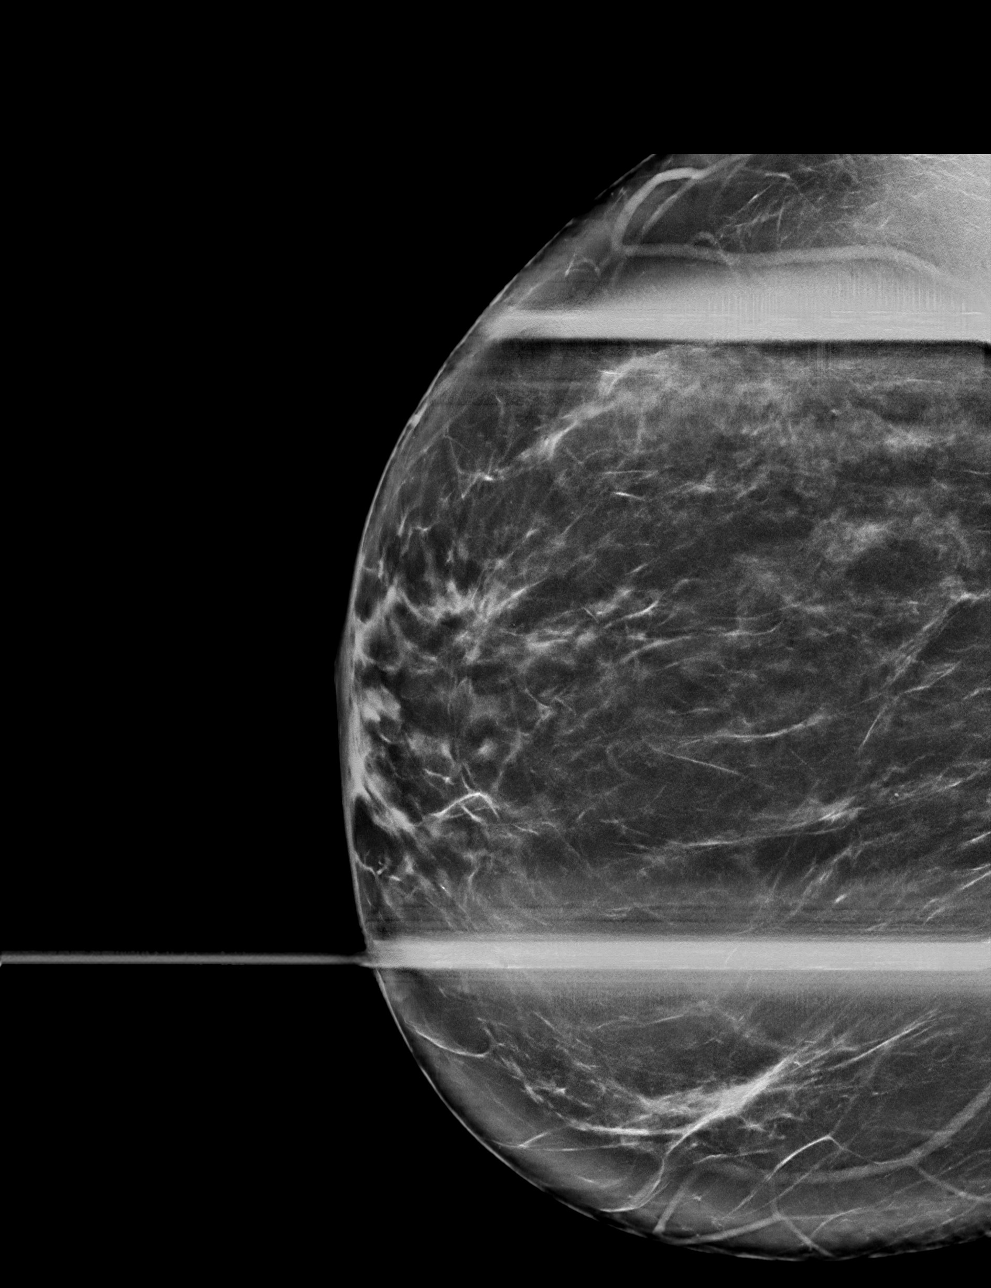

[R CC tomo · tomo slice 41/82.0]
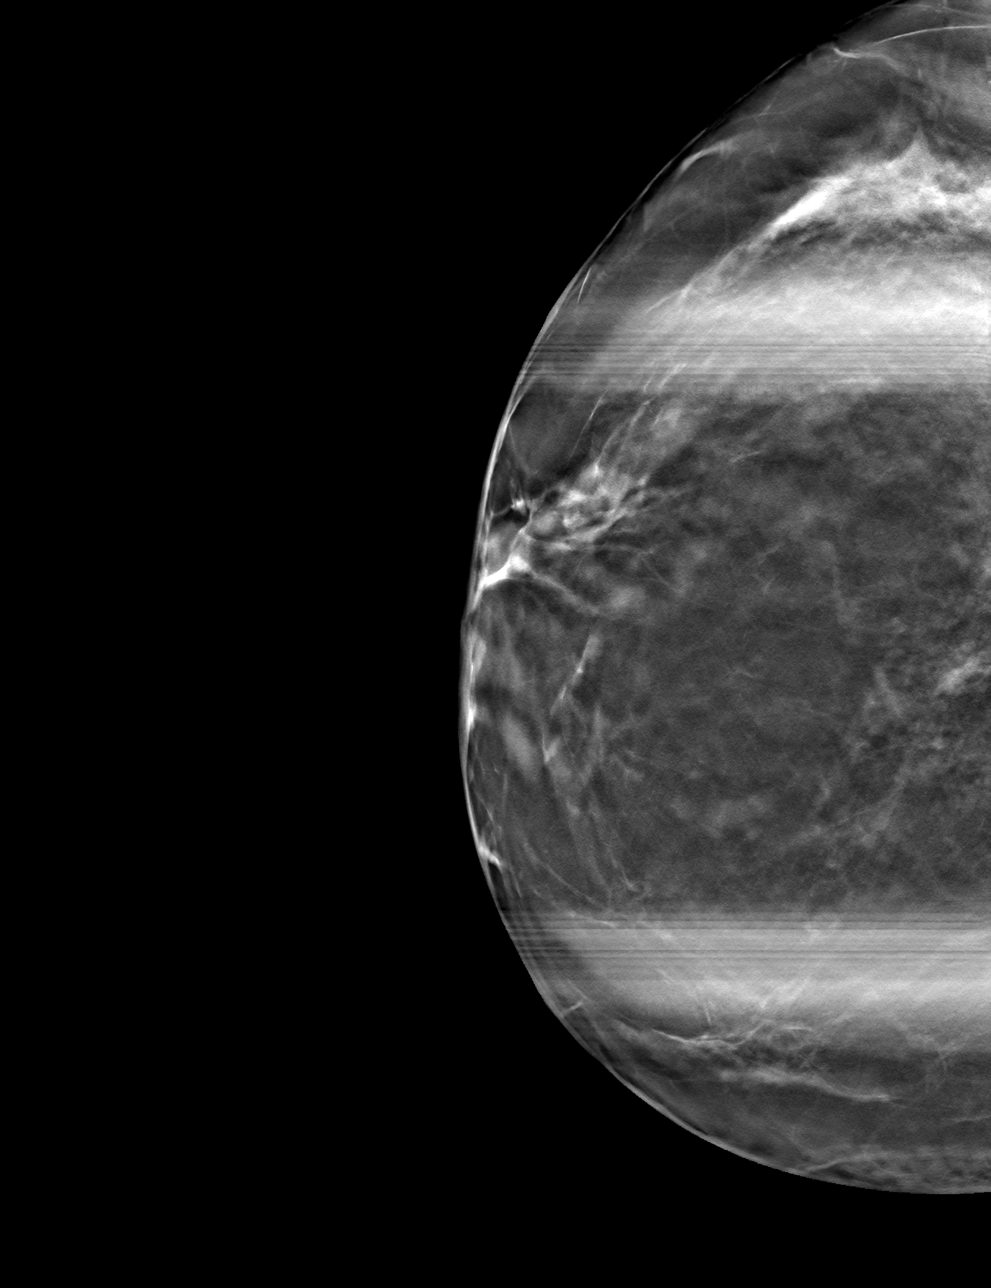

[R MLO tomo · tomo slice 44/87.0]
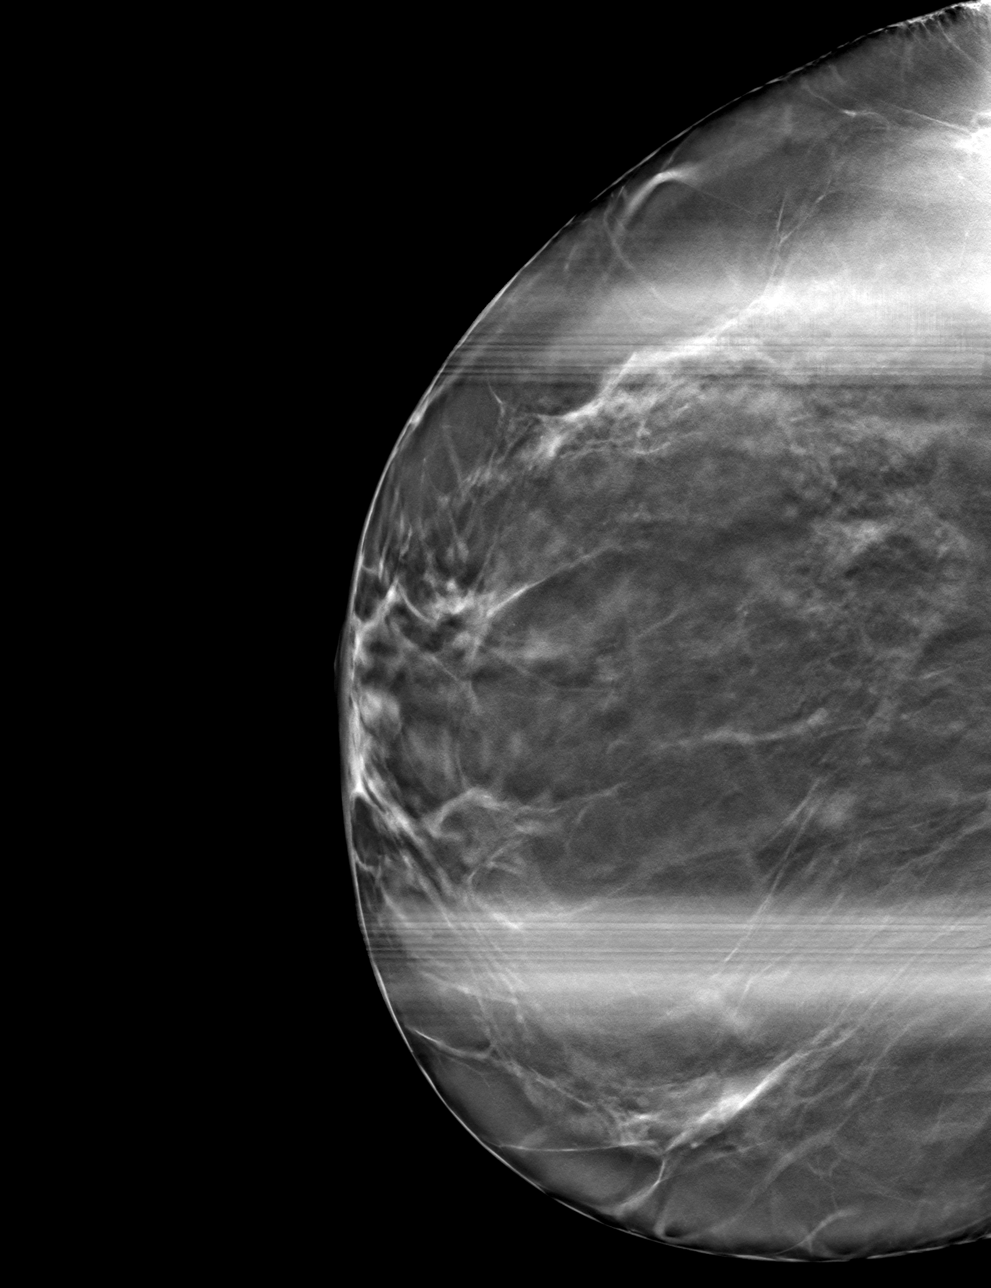

[4 of 12 positions shown; findings below may reference images not displayed]

ACR Breast Density Category c: The breast tissue is heterogeneously
dense, which may obscure small masses.
FINDINGS: The possible right breast mass resolves on additional imaging. No
discrete mass identified.

Targeted ultrasound is performed, showing no sonographic
abnormalities.
IMPRESSION: No mammographic or sonographic evidence of malignancy in the right
breast.

RECOMMENDATION:
Annual screening mammography.

I have discussed the findings and recommendations with the patient.
If applicable, a reminder letter will be sent to the patient
regarding the next appointment.

BI-RADS CATEGORY  1: Negative.

## 2024-01-30 ENCOUNTER — Ambulatory Visit (INDEPENDENT_AMBULATORY_CARE_PROVIDER_SITE_OTHER): Admitting: Psychology

## 2024-01-30 DIAGNOSIS — F4321 Adjustment disorder with depressed mood: Secondary | ICD-10-CM | POA: Diagnosis not present

## 2024-01-30 NOTE — Progress Notes (Signed)
 [facility] Counselor/Therapist Progress Note  Patient ID: BISMA KLETT, MRN: [id number],    Date: 01/30/2024  Time Spent: 10:00am-10:55am   55 minutes   Treatment Type: Individual Therapy  Reported Symptoms: stress, anxiety  Mental Status Exam: Appearance:  Casual     Behavior: Appropriate  Motor: Normal  Speech/Language:  Normal Rate  Affect: Appropriate  Mood: normal  Thought process: normal  Thought content:   WNL  Sensory/Perceptual disturbances:   WNL  Orientation: oriented to person, place, time/date, and situation  Attention: Good  Concentration: Good  Memory: WNL  Fund of knowledge:  Good  Insight:   Good  Judgment:  Good  Impulse Control: Good   Risk Assessment: Danger to Self:  No Self-injurious Behavior: No Danger to Others: No Duty to Warn:no Physical Aggression / Violence:No  Access to Firearms a concern: No  Gang Involvement:No   Subjective: Pt present for face-to-face individual therapy via video.  Pt consents to telehealth video session and is aware of limitations and benefits of virtual sessions. Location of pt: home Location of therapist: home office.   Pt talked about feeling angry a lot lately.   Pt states she does not like her father's new wife.  Addressed what pt does not like about her stepmom.   Her stepmom Erminio acts like she knows everything even when she doesn't.  Erminio is like pt's brother which is a bother to her.   Pt thinks Erminio does not like her.  Pt left her out of the Saturday night movie nights bc she wants time with her father.    Helped pt process her feelings and how to navigate her relationship with Erminio.   Pt feels like she is losing her father which is a loss for her.  Addressed how pt is grieving the changes in her relationship with her father. Worked on self care strategies.  Provided supportive therapy.    Interventions: Cognitive Behavioral Therapy and Insight-Oriented  Diagnosis:   F43.21  Plan of Care:   Recommend ongoing therapy.   Pt participated in setting treatment goals.  Plan to meet every two weeks.   Pt agrees with treatment plan.   Treatment Plan Client Abilities/Strengths  Pt is bright, engaging, and motivated for therapy.   Client Treatment Preferences  Individual therapy.  Client Statement of Needs  Improve coping skills.  Symptoms  Depressed or irritable mood. Low self-esteem. Unresolved grief issues.  Problems Addressed  Unipolar Depression Goals 1. Alleviate depressive symptoms and return to previous level of effective functioning. 2. Appropriately grieve the loss in order to normalize mood and to return to previously adaptive level of functioning. Objective Learn and implement behavioral strategies to overcome depression. Target Date: 2024-04-12 Frequency: Biweekly  Progress: 50 Modality: individual  Related Interventions Engage the client in behavioral activation, increasing his/her activity level and contact with sources of reward, while identifying processes that inhibit activation.  Use behavioral techniques such as instruction, rehearsal, role-playing, role reversal, as needed, to facilitate activity in the client's daily life; reinforce success. Assist the client in developing skills that increase the likelihood of deriving pleasure from behavioral activation (e.g., assertiveness skills, developing an exercise plan, less internal/more external focus, increased social involvement); reinforce success. Objective Identify important people in life, past and present, and describe the quality, good and poor, of those relationships. Target Date: 2024-04-12 Frequency: Biweekly  Progress: 50 Modality: individual  Related Interventions Conduct Interpersonal Therapy beginning with the assessment of the client's interpersonal inventory of important  past and present relationships; develop a case formulation linking depression to grief, interpersonal  role disputes, role transitions, and/or interpersonal deficits). Objective Learn and implement problem-solving and decision-making skills. Target Date: 2024-04-12 Frequency: Biweekly  Progress: 50 Modality: individual  Related Interventions Conduct Problem-Solving Therapy using techniques such as psychoeducation, modeling, and role-playing to teach client problem-solving skills (i.e., defining a problem specifically, generating possible solutions, evaluating the pros and cons of each solution, selecting and implementing a plan of action, evaluating the efficacy of the plan, accepting or revising the plan); role-play application of the problem-solving skill to a real life issue. Encourage in the client the development of a positive problem orientation in which problems and solving them are viewed as a natural part of life and not something to be feared, despaired, or avoided. 3. Develop healthy interpersonal relationships that lead to the alleviation and help prevent the relapse of depression. 4. Develop healthy thinking patterns and beliefs about self, others, and the world that lead to the alleviation and help prevent the relapse of depression. 5. Recognize, accept, and cope with feelings of depression. Diagnosis F43.21 Conditions For Discharge Achievement of treatment goals and objectives   Veva Alma, LCSW

## 2024-02-22 ENCOUNTER — Ambulatory Visit: Admitting: Psychology

## 2024-03-01 ENCOUNTER — Ambulatory Visit: Admitting: Psychology

## 2024-03-01 DIAGNOSIS — F4321 Adjustment disorder with depressed mood: Secondary | ICD-10-CM

## 2024-03-01 NOTE — Progress Notes (Signed)
 [facility] Counselor/Therapist Progress Note  Patient ID: Mackenzie Khan, MRN: [id number],    Date: 03/01/2024  Time Spent: 11:00am-11:55am   55 minutes   Treatment Type: Individual Therapy  Reported Symptoms: stress, anxiety  Mental Status Exam: Appearance:  Casual     Behavior: Appropriate  Motor: Normal  Speech/Language:  Normal Rate  Affect: Appropriate  Mood: normal  Thought process: normal  Thought content:   WNL  Sensory/Perceptual disturbances:   WNL  Orientation: oriented to person, place, time/date, and situation  Attention: Good  Concentration: Good  Memory: WNL  Fund of knowledge:  Good  Insight:   Good  Judgment:  Good  Impulse Control: Good   Risk Assessment: Danger to Self:  No Self-injurious Behavior: No Danger to Others: No Duty to Warn:no Physical Aggression / Violence:No  Access to Firearms a concern: No  Gang Involvement:No   Subjective: Pt present for face-to-face individual therapy via video.  Pt consents to telehealth video session and is aware of limitations and benefits of virtual sessions. Location of pt: home Location of therapist: home office.   Pt talked about having an argument with her husband this morning.  She states he is very inflexible and it is frustrating for her.   Pt states things have been stressful.   Her daughter Vernell broke her foot.  Pt is feeling very tired and is not taking good care of herself.  She feels overwhelmed parenting 3 neuro divergent children.  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  Worked with pt on how she can structure her days and create better balance between what she needs to do for others and what she can do to take care of herself.  Worked on self care strategies.  Provided supportive therapy.    Interventions: Cognitive Behavioral Therapy and Insight-Oriented  Diagnosis:  F43.21  Plan of Care:   Recommend  ongoing therapy.   Pt participated in setting treatment goals.  Plan to meet every two weeks.   Pt agrees with treatment plan.   Treatment Plan Client Abilities/Strengths  Pt is bright, engaging, and motivated for therapy.   Client Treatment Preferences  Individual therapy.  Client Statement of Needs  Improve coping skills.  Symptoms  Depressed or irritable mood. Low self-esteem. Unresolved grief issues.  Problems Addressed  Unipolar Depression Goals 1. Alleviate depressive symptoms and return to previous level of effective functioning. 2. Appropriately grieve the loss in order to normalize mood and to return to previously adaptive level of functioning. Objective Learn and implement behavioral strategies to overcome depression. Target Date: 2024-04-12 Frequency: Biweekly  Progress: 50 Modality: individual  Related Interventions Engage the client in behavioral activation, increasing his/her activity level and contact with sources of reward, while identifying processes that inhibit activation.  Use behavioral techniques such as instruction, rehearsal, role-playing, role reversal, as needed, to facilitate activity in the client's daily life; reinforce success. Assist the client in developing skills that increase the likelihood of deriving pleasure from behavioral activation (e.g., assertiveness skills, developing an exercise plan, less internal/more external focus, increased social involvement); reinforce success. Objective Identify important people in life, past and present, and describe the quality, good and poor, of those relationships. Target Date: 2024-04-12 Frequency: Biweekly  Progress: 50 Modality: individual  Related Interventions Conduct Interpersonal Therapy beginning with the assessment of the client's interpersonal inventory of important past and present relationships; develop a case formulation linking depression  to grief, interpersonal role disputes, role transitions,  and/or interpersonal deficits). Objective Learn and implement problem-solving and decision-making skills. Target Date: 2024-04-12 Frequency: Biweekly  Progress: 50 Modality: individual  Related Interventions Conduct Problem-Solving Therapy using techniques such as psychoeducation, modeling, and role-playing to teach client problem-solving skills (i.e., defining a problem specifically, generating possible solutions, evaluating the pros and cons of each solution, selecting and implementing a plan of action, evaluating the efficacy of the plan, accepting or revising the plan); role-play application of the problem-solving skill to a real life issue. Encourage in the client the development of a positive problem orientation in which problems and solving them are viewed as a natural part of life and not something to be feared, despaired, or avoided. 3. Develop healthy interpersonal relationships that lead to the alleviation and help prevent the relapse of depression. 4. Develop healthy thinking patterns and beliefs about self, others, and the world that lead to the alleviation and help prevent the relapse of depression. 5. Recognize, accept, and cope with feelings of depression. Diagnosis F43.21 Conditions For Discharge Achievement of treatment goals and objectives   Veva Alma, LCSW

## 2024-03-14 ENCOUNTER — Ambulatory Visit: Admitting: Psychology

## 2024-03-14 DIAGNOSIS — F4321 Adjustment disorder with depressed mood: Secondary | ICD-10-CM

## 2024-03-14 NOTE — Progress Notes (Signed)
 [facility] Counselor/Therapist Progress Note  Patient ID: Mackenzie Khan, MRN: [id number],    Date: 03/14/2024  Time Spent: 10:00am-10:55am   55 minutes   Treatment Type: Individual Therapy  Reported Symptoms: stress, anxiety  Mental Status Exam: Appearance:  Casual     Behavior: Appropriate  Motor: Normal  Speech/Language:  Normal Rate  Affect: Appropriate  Mood: normal  Thought process: normal  Thought content:   WNL  Sensory/Perceptual disturbances:   WNL  Orientation: oriented to person, place, time/date, and situation  Attention: Good  Concentration: Good  Memory: WNL  Fund of knowledge:  Good  Insight:   Good  Judgment:  Good  Impulse Control: Good   Risk Assessment: Danger to Self:  No Self-injurious Behavior: No Danger to Others: No Duty to Warn:no Physical Aggression / Violence:No  Access to Firearms a concern: No  Gang Involvement:No   Subjective: Pt present for face-to-face individual therapy via video.  Pt consents to telehealth video session and is aware of limitations and benefits of virtual sessions. Location of pt: home Location of therapist: home office.   Pt talked about being very busy and not feeling like being vulnerable today.   Pt is worried about her mother in law who has dementia.   When pt spends time with her it is triggering for pt bc it reminds her of when her mother was alive and had alzheimers.  Helped pt process her feelings and how her mother in law is impacting her.  [name] mother is law is being paranoid and agitated at times.  [name] husband Redell and his brother Chyrl are managing thing with their mother but it is not the way pt would handle things.  Addressed if pt can let go of trying to control what is not hers to own.   Worked on healthy boundary setting.   Pt is feeling very tired and is not taking good care of herself.  She feels overwhelmed parenting 3 neuro divergent children.   Worked on self care  strategies.  Provided supportive therapy.    Interventions: Cognitive Behavioral Therapy and Insight-Oriented  Diagnosis:  F43.21  Plan of Care:   Recommend ongoing therapy.   Pt participated in setting treatment goals.  Plan to meet every two weeks.   Pt agrees with treatment plan.   Treatment Plan Client Abilities/Strengths  Pt is bright, engaging, and motivated for therapy.   Client Treatment Preferences  Individual therapy.  Client Statement of Needs  Improve coping skills.  Symptoms  Depressed or irritable mood. Low self-esteem. Unresolved grief issues.  Problems Addressed  Unipolar Depression Goals 1. Alleviate depressive symptoms and return to previous level of effective functioning. 2. Appropriately grieve the loss in order to normalize mood and to return to previously adaptive level of functioning. Objective Learn and implement behavioral strategies to overcome depression. Target Date: 2024-04-12 Frequency: Biweekly  Progress: 50 Modality: individual  Related Interventions Engage the client in behavioral activation, increasing his/her activity level and contact with sources of reward, while identifying processes that inhibit activation.  Use behavioral techniques such as instruction, rehearsal, role-playing, role reversal, as needed, to facilitate activity in the client's daily life; reinforce success. Assist the client in developing skills that increase the likelihood of deriving pleasure from behavioral activation (e.g., assertiveness skills, developing an exercise plan, less internal/more external focus, increased social involvement); reinforce success. Objective Identify important people in life, past and present, and describe the quality, good and poor, of those relationships. Target Date: 2024-04-12  Frequency: Biweekly  Progress: 50 Modality: individual  Related Interventions Conduct Interpersonal Therapy beginning with the assessment of the client's  interpersonal inventory of important past and present relationships; develop a case formulation linking depression to grief, interpersonal role disputes, role transitions, and/or interpersonal deficits). Objective Learn and implement problem-solving and decision-making skills. Target Date: 2024-04-12 Frequency: Biweekly  Progress: 50 Modality: individual  Related Interventions Conduct Problem-Solving Therapy using techniques such as psychoeducation, modeling, and role-playing to teach client problem-solving skills (i.e., defining a problem specifically, generating possible solutions, evaluating the pros and cons of each solution, selecting and implementing a plan of action, evaluating the efficacy of the plan, accepting or revising the plan); role-play application of the problem-solving skill to a real life issue. Encourage in the client the development of a positive problem orientation in which problems and solving them are viewed as a natural part of life and not something to be feared, despaired, or avoided. 3. Develop healthy interpersonal relationships that lead to the alleviation and help prevent the relapse of depression. 4. Develop healthy thinking patterns and beliefs about self, others, and the world that lead to the alleviation and help prevent the relapse of depression. 5. Recognize, accept, and cope with feelings of depression. Diagnosis F43.21 Conditions For Discharge Achievement of treatment goals and objectives   Veva Alma, LCSW

## 2024-04-01 ENCOUNTER — Ambulatory Visit: Admitting: Psychology

## 2024-04-01 DIAGNOSIS — F4321 Adjustment disorder with depressed mood: Secondary | ICD-10-CM | POA: Diagnosis not present

## 2024-04-01 NOTE — Progress Notes (Signed)
 [facility] Counselor/Therapist Progress Note  Patient ID: Mackenzie Khan, MRN: [id number],    Date: 04/01/2024  Time Spent: 10:00am-10:55am   55 minutes   Treatment Type: Individual Therapy  Reported Symptoms: stress, anxiety  Mental Status Exam: Appearance:  Casual     Behavior: Appropriate  Motor: Normal  Speech/Language:  Normal Rate  Affect: Appropriate  Mood: normal  Thought process: normal  Thought content:   WNL  Sensory/Perceptual disturbances:   WNL  Orientation: oriented to person, place, time/date, and situation  Attention: Good  Concentration: Good  Memory: WNL  Fund of knowledge:  Good  Insight:   Good  Judgment:  Good  Impulse Control: Good   Risk Assessment: Danger to Self:  No Self-injurious Behavior: No Danger to Others: No Duty to Warn:no Physical Aggression / Violence:No  Access to Firearms a concern: No  Gang Involvement:No   Subjective: Pt present for face-to-face individual therapy via video.  Pt consents to telehealth video session and is aware of limitations and benefits of virtual sessions. Location of pt: home Location of therapist: home office.   Pt talked about her mother in law who recently had a stroke and is in the cardiac ICU.   She also has dementia.  [name] mother in law will need care at home when discharged from the hospital and pt is helping to find resources.  [name] husband is very stressed and pt is worried the care of her mother in law will fall on her.  Pt is feeling overwhelmed.  They will have to work out in home care until she can get placement in a [facility] community.   [name] husband Mackenzie Khan and his brother Mackenzie Khan will try to manage things with their mother but it is not the way pt would handle things.  Addressed if pt can let go of trying to control what is not hers to own.   Worked on healthy boundary setting.   Pt talked about her relationship with her husband.  She feels like there is an imbalance of power bc he  is the financial provider and she does not work.  Addressed how this impacts pt.  Helped pt process relationship dynamics.  Pt is feeling very tired and is not taking good care of herself.  She feels overwhelmed parenting 3 neuro divergent children.   Worked on self care strategies.  Provided supportive therapy.    Interventions: Cognitive Behavioral Therapy and Insight-Oriented  Diagnosis:  F43.21  Plan of Care:   Recommend ongoing therapy.   Pt participated in setting treatment goals.  Plan to meet every two weeks.   Pt agrees with treatment plan.   Treatment Plan Client Abilities/Strengths  Pt is bright, engaging, and motivated for therapy.   Client Treatment Preferences  Individual therapy.  Client Statement of Needs  Improve coping skills.  Symptoms  Depressed or irritable mood. Low self-esteem. Unresolved grief issues.  Problems Addressed  Unipolar Depression Goals 1. Alleviate depressive symptoms and return to previous level of effective functioning. 2. Appropriately grieve the loss in order to normalize mood and to return to previously adaptive level of functioning. Objective Learn and implement behavioral strategies to overcome depression. Target Date: 2024-04-12 Frequency: Biweekly  Progress: 50 Modality: individual  Related Interventions Engage the client in behavioral activation, increasing his/her activity level and contact with sources of reward, while identifying processes that inhibit activation.  Use behavioral techniques such as instruction, rehearsal, role-playing, role reversal, as needed, to facilitate activity in the client's daily  life; reinforce success. Assist the client in developing skills that increase the likelihood of deriving pleasure from behavioral activation (e.g., assertiveness skills, developing an exercise plan, less internal/more external focus, increased social involvement); reinforce success. Objective Identify important people in life,  past and present, and describe the quality, good and poor, of those relationships. Target Date: 2024-04-12 Frequency: Biweekly  Progress: 50 Modality: individual  Related Interventions Conduct Interpersonal Therapy beginning with the assessment of the client's interpersonal inventory of important past and present relationships; develop a case formulation linking depression to grief, interpersonal role disputes, role transitions, and/or interpersonal deficits). Objective Learn and implement problem-solving and decision-making skills. Target Date: 2024-04-12 Frequency: Biweekly  Progress: 50 Modality: individual  Related Interventions Conduct Problem-Solving Therapy using techniques such as psychoeducation, modeling, and role-playing to teach client problem-solving skills (i.e., defining a problem specifically, generating possible solutions, evaluating the pros and cons of each solution, selecting and implementing a plan of action, evaluating the efficacy of the plan, accepting or revising the plan); role-play application of the problem-solving skill to a real life issue. Encourage in the client the development of a positive problem orientation in which problems and solving them are viewed as a natural part of life and not something to be feared, despaired, or avoided. 3. Develop healthy interpersonal relationships that lead to the alleviation and help prevent the relapse of depression. 4. Develop healthy thinking patterns and beliefs about self, others, and the world that lead to the alleviation and help prevent the relapse of depression. 5. Recognize, accept, and cope with feelings of depression. Diagnosis F43.21 Conditions For Discharge Achievement of treatment goals and objectives   Veva Alma, LCSW

## 2024-04-25 ENCOUNTER — Encounter: Payer: Self-pay | Admitting: Psychology

## 2024-04-25 ENCOUNTER — Ambulatory Visit (INDEPENDENT_AMBULATORY_CARE_PROVIDER_SITE_OTHER): Admitting: Psychology

## 2024-04-25 DIAGNOSIS — F4321 Adjustment disorder with depressed mood: Secondary | ICD-10-CM

## 2024-04-25 NOTE — Progress Notes (Signed)
 [facility] Counselor Initial Adult Exam  Name: Mackenzie Khan Date: 04/25/2024 MRN: [id number] DOB: 05-15-1975 PCP: Mackenzie Camie Pepper, PA-C  Time spent: 10:00am-10:55am    55 minutes  Guardian/Payee:  [name] requested: No   Reason for Visit /Presenting Problem: Pt present for face-to-face initial assessment update via video.  Pt consents to telehealth video session and is aware of limitations and benefits of virtual sessions.  Location of pt: home Location of therapist: home office.  Pt talked about the holidays and feeling the stress of planning for time with extended family.   [name] father remarried after pt's mother's death and pt does not like his new wife.   Helped pt process her feelings and family dynamics.  Pt continues to need therapy to address family issues and work on pharmacologist.    Pt has a lot of stress parenting bc her children are neurodivergent.    Pt also feels like she and her husband Mackenzie Khan have grown apart.  Worked on communication issues and relationship dynamics.   Reviewed pt's treatment plan for annual update.  Updated pt's treatment plan and IA.    Pt participated in setting treatment goals.   Plan to continue to meet every two weeks.    Mental Status Exam: Appearance:   Casual     Behavior:  Appropriate  Motor:  Normal  Speech/Language:   Normal Rate  Affect:  Appropriate  Mood:  normal  Thought process:  normal  Thought content:    WNL  Sensory/Perceptual disturbances:    WNL  Orientation:  oriented to person, place, time/date, and situation  Attention:  Good  Concentration:  Good  Memory:  WNL  Fund of knowledge:   Good  Insight:    Good  Judgment:   Good  Impulse Control:  Good    Reported Symptoms:  sadness  Risk Assessment: Danger to Self:  No Self-injurious Behavior: No Danger to Others: No Duty to Warn:no Physical Aggression / Violence:No  Access to Firearms a concern: No  Gang Involvement:No   Patient / guardian was educated about steps to take if suicide or homicide risk level increases between visits: n/a While future psychiatric events cannot be accurately predicted, the patient does not currently require acute inpatient psychiatric care and does not currently meet [place]  involuntary commitment criteria.  Substance Abuse History: Current substance abuse: No     Past Psychiatric History:   Previous psychological history is significant for depression Outpatient Providers:pt was in therapy with Olam Hind until Colman retired. History of Psych Hospitalization: No  Psychological Testing: n/a   Abuse History:  Victim of: No., n/a   Report needed: No. Victim of Neglect:No. Perpetrator of n/a  Witness / Exposure to Domestic Violence: No   Protective Services Involvement: No  Witness to [employer] Violence:  No   Family History:  Family History  Problem Relation Age of Onset   Breast cancer Maternal Grandmother     Living situation: the patient lives with their family. She lives with her husband and 3 children ages 50 yo twins and 50 yo son.    Pt grew up with both parents and a brother 5 years older.   Brother has history of substance abuse.   [name] mother has dementia.   Pt has a positive family history for mental illness and substance abuse.   Sexual Orientation: Straight  Relationship Status: married  Name of spouse / other:Mackenzie Khan If a parent, number of children /  ages:pt has 3 children ages 50 yo twins and 50 yo son.  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  Military Service: No   Educational History: Education: Risk Manager: Protestant  Any cultural differences that may affect / interfere with treatment:  not applicable   Recreation/Hobbies: reading  Stressors: Marital or family conflict    Strengths:  Supportive Relationships, Hopefulness, Journalist, Newspaper, and Able to Communicate Effectively  Barriers:  none   Legal History: Pending legal issue / charges: The patient has no significant history of legal issues. History of legal issue / charges: n/a  Medical History/Surgical History: reviewed Past Medical History:  Diagnosis Date   Depression    GERD (gastroesophageal reflux disease)    pregnancy related-pepcid /prilosec     Past Surgical History:  Procedure Laterality Date   CESAREAN SECTION     CESAREAN SECTION  08/03/2011   Procedure: CESAREAN SECTION;  Surgeon: Rosaline DELENA Luna, MD;  Location: [facility];  Service: Gynecology;  Laterality: N/A;  Repeat Cesarean Section Delivery  Boy  @ 0157,     tab  2007   WRIST FRACTURE SURGERY      Medications: Current Outpatient Medications  Medication Sig Dispense Refill   Drospirenone-Ethinyl Estradiol (NIKKI PO) Take 1 each by mouth once.     multivitamin-iron-minerals-folic acid (CENTRUM) chewable tablet Chew 1 tablet by mouth daily.     naproxen  (NAPROSYN ) 500 MG tablet Take 1 tablet (500 mg total) by mouth 2 (two) times daily. (Patient not taking: Reported on 08/14/2020) 30 tablet 0   oseltamivir (TAMIFLU) 75 MG capsule Take 75 mg by mouth daily. (Patient not taking: Reported on 08/14/2020)  0   terbinafine  (LAMISIL ) 250 MG tablet Please take one a day x 7days, repeat every 4 weeks x 4 months (Patient not taking: Reported on 08/14/2020) 28 tablet 0   terbinafine  (LAMISIL ) 250 MG tablet Please take one a day x 7days, repeat every 4 weeks x 4 months (Patient not taking: Reported on 08/14/2020) 28 tablet 0   terbinafine  (LAMISIL ) 250 MG tablet Please take one a day x 7days, repeat every 4 weeks x 4 months 28 tablet 0   valACYclovir (VALTREX) 1000 MG tablet Take 1,000 mg by mouth as needed.  (Patient not taking: Reported on 08/14/2020)  3   valACYclovir (VALTREX) 1000 MG tablet valacyclovir 1 gram tablet  TAKE 1 TABLET EVERY 12 HOURS     No current  facility-administered medications for this visit.    Allergies  Allergen Reactions   Latex Itching    Vaginal irritation-itching , swelling   Sulfa Antibiotics     Lowers wbc     Diagnoses:  F43.21  Plan of Care:  Recommend ongoing therapy.  Pt participated in setting treatment goals.  Plan to meet every two weeks.  Pt verbally agreed to treatment plan.  Sent pt signature form for treatment plan approval. Treatment Plan Client Abilities/Strengths  Pt is bright, engaging, and motivated for therapy.   Client Treatment Preferences  Individual therapy.  Client Statement of Needs  Improve coping skills.  Symptoms  Depressed or irritable mood. Low self-esteem. Unresolved grief issues.  Problems Addressed  Unipolar Depression Goals 1. Alleviate depressive symptoms and return to previous level of effective functioning. 2. Appropriately grieve the loss in order to normalize mood and to return to previously adaptive level of functioning. Objective Learn and implement behavioral strategies to overcome depression. Target  Date: 2025-04-25 Frequency: Biweekly  Progress: 55 Modality: individual  Related Interventions Engage the client in behavioral activation, increasing his/her activity level and contact with sources of reward, while identifying processes that inhibit activation.  Use behavioral techniques such as instruction, rehearsal, role-playing, role reversal, as needed, to facilitate activity in the client's daily life; reinforce success. Assist the client in developing skills that increase the likelihood of deriving pleasure from behavioral activation (e.g., assertiveness skills, developing an exercise plan, less internal/more external focus, increased social involvement); reinforce success. Objective Identify important people in life, past and present, and describe the quality, good and poor, of those relationships. Target Date: 2025-04-25 Frequency: Biweekly  Progress: 55  Modality: individual  Related Interventions Conduct Interpersonal Therapy beginning with the assessment of the client's interpersonal inventory of important past and present relationships; develop a case formulation linking depression to grief, interpersonal role disputes, role transitions, and/or interpersonal deficits). Objective Learn and implement problem-solving and decision-making skills. Target Date: 2025-04-25 Frequency: Biweekly  Progress: 55 Modality: individual  Related Interventions Conduct Problem-Solving Therapy using techniques such as psychoeducation, modeling, and role-playing to teach client problem-solving skills (i.e., defining a problem specifically, generating possible solutions, evaluating the pros and cons of each solution, selecting and implementing a plan of action, evaluating the efficacy of the plan, accepting or revising the plan); role-play application of the problem-solving skill to a real life issue. Encourage in the client the development of a positive problem orientation in which problems and solving them are viewed as a natural part of life and not something to be feared, despaired, or avoided. 3. Develop healthy interpersonal relationships that lead to the alleviation and help prevent the relapse of depression. 4. Develop healthy thinking patterns and beliefs about self, others, and the world that lead to the alleviation and help prevent the relapse of depression. 5. Recognize, accept, and cope with feelings of depression. Diagnosis F43.21 Conditions For Discharge Achievement of treatment goals and objectives    Veva Alma, LCSW

## 2024-05-28 ENCOUNTER — Ambulatory Visit: Admitting: Psychology

## 2024-05-28 DIAGNOSIS — F4321 Adjustment disorder with depressed mood: Secondary | ICD-10-CM

## 2024-05-28 NOTE — Progress Notes (Signed)
 "  [facility] Counselor/Therapist Progress Note  Patient ID: [name], MRN: [id number],    Date: 05/28/2024  Time Spent: 10:00am-10:55am   55 minutes   Treatment Type: Individual Therapy  Reported Symptoms: stress, frustration  Mental Status Exam: Appearance:  Casual     Behavior: Appropriate  Motor: Normal  Speech/Language:  Normal Rate  Affect: Appropriate  Mood: normal  Thought process: normal  Thought content:   WNL  Sensory/Perceptual disturbances:   WNL  Orientation: oriented to person, place, time/date, and situation  Attention: Good  Concentration: Good  Memory: WNL  Fund of knowledge:  Good  Insight:   Good  Judgment:  Good  Impulse Control: Good   Risk Assessment: Danger to Self:  No Self-injurious Behavior: No Danger to Others: No Duty to Warn:no Physical Aggression / Violence:No  Access to Firearms a concern: No  Gang Involvement:No   Subjective: Pt present for face-to-face individual therapy via video.  Pt consents to telehealth video session and is aware of limitations and benefits of virtual sessions. Location of pt: home Location of therapist: home office.   Pt talked about being stressed bc of planning for going on Rachel's school trip to [place] this Thursday.  Pt is not excited about the other parents who are attending and does not like the teacher who is going.  Pt is anxious about how her husband will do taking care of the other kids and their home while she is gone.  Pt talked about being upset about the state of the country and the world.   She is responding to comments on facebook and feeling upset with the lack of compassion that some people have.  Addressed how this impacts her.   Worked on self care strategies.   Provided supportive therapy.   Interventions: Cognitive Behavioral Therapy and Insight-Oriented  Diagnosis:  F43.21  Plan of Care:  Recommend ongoing therapy.  Pt participated in setting treatment goals.   Plan to meet every two weeks.  Pt verbally agreed to treatment plan.  Sent pt signature form for treatment plan approval. Treatment Plan Client Abilities/Strengths  Pt is bright, engaging, and motivated for therapy.   Client Treatment Preferences  Individual therapy.  Client Statement of Needs  Improve coping skills.  Symptoms  Depressed or irritable mood. Low self-esteem. Unresolved grief issues.  Problems Addressed  Unipolar Depression Goals 1. Alleviate depressive symptoms and return to previous level of effective functioning. 2. Appropriately grieve the loss in order to normalize mood and to return to previously adaptive level of functioning. Objective Learn and implement behavioral strategies to overcome depression. Target Date: 2025-04-25 Frequency: Biweekly  Progress: 55 Modality: individual  Related Interventions Engage the client in behavioral activation, increasing his/her activity level and contact with sources of reward, while identifying processes that inhibit activation.  Use behavioral techniques such as instruction, rehearsal, role-playing, role reversal, as needed, to facilitate activity in the client's daily life; reinforce success. Assist the client in developing skills that increase the likelihood of deriving pleasure from behavioral activation (e.g., assertiveness skills, developing an exercise plan, less internal/more external focus, increased social involvement); reinforce success. Objective Identify important people in life, past and present, and describe the quality, good and poor, of those relationships. Target Date: 2025-04-25 Frequency: Biweekly  Progress: 55 Modality: individual  Related Interventions Conduct Interpersonal Therapy beginning with the assessment of the client's interpersonal inventory of important past and present relationships; develop a case formulation linking depression to grief, interpersonal role disputes, role  transitions, and/or  interpersonal deficits). Objective Learn and implement problem-solving and decision-making skills. Target Date: 2025-04-25 Frequency: Biweekly  Progress: 55 Modality: individual  Related Interventions Conduct Problem-Solving Therapy using techniques such as psychoeducation, modeling, and role-playing to teach client problem-solving skills (i.e., defining a problem specifically, generating possible solutions, evaluating the pros and cons of each solution, selecting and implementing a plan of action, evaluating the efficacy of the plan, accepting or revising the plan); role-play application of the problem-solving skill to a real life issue. Encourage in the client the development of a positive problem orientation in which problems and solving them are viewed as a natural part of life and not something to be feared, despaired, or avoided. 3. Develop healthy interpersonal relationships that lead to the alleviation and help prevent the relapse of depression. 4. Develop healthy thinking patterns and beliefs about self, others, and the world that lead to the alleviation and help prevent the relapse of depression. 5. Recognize, accept, and cope with feelings of depression. Diagnosis F43.21 Conditions For Discharge Achievement of treatment goals and objectives   Veva Alma, LCSW    "

## 2024-06-12 ENCOUNTER — Ambulatory Visit: Admitting: Psychology

## 2024-06-27 ENCOUNTER — Ambulatory Visit: Admitting: Psychology

## 2024-07-11 ENCOUNTER — Ambulatory Visit: Admitting: Psychology

## 2024-07-25 ENCOUNTER — Ambulatory Visit: Admitting: Psychology

## 2024-08-09 ENCOUNTER — Ambulatory Visit: Admitting: Psychology
